# Patient Record
Sex: Female | Born: 1985 | Race: White | Hispanic: No | Marital: Married | Smoking: Never smoker
Health system: Southern US, Community
[De-identification: ages and names within clinical notes are randomized; demographics above are authoritative.]

## PROBLEM LIST (undated history)

## (undated) DIAGNOSIS — O139 Gestational [pregnancy-induced] hypertension without significant proteinuria, unspecified trimester: Secondary | ICD-10-CM

## (undated) DIAGNOSIS — Z803 Family history of malignant neoplasm of breast: Secondary | ICD-10-CM

## (undated) DIAGNOSIS — Z8719 Personal history of other diseases of the digestive system: Secondary | ICD-10-CM

## (undated) DIAGNOSIS — Z789 Other specified health status: Secondary | ICD-10-CM

## (undated) DIAGNOSIS — G43909 Migraine, unspecified, not intractable, without status migrainosus: Secondary | ICD-10-CM

## (undated) DIAGNOSIS — Z8041 Family history of malignant neoplasm of ovary: Secondary | ICD-10-CM

## (undated) HISTORY — DX: Family history of malignant neoplasm of breast: Z80.3

## (undated) HISTORY — DX: Family history of malignant neoplasm of ovary: Z80.41

---

## 2005-12-26 ENCOUNTER — Ambulatory Visit (HOSPITAL_COMMUNITY): Admission: RE | Admit: 2005-12-26 | Discharge: 2005-12-26 | Payer: Self-pay | Admitting: Family Medicine

## 2007-06-17 ENCOUNTER — Emergency Department (HOSPITAL_COMMUNITY): Admission: EM | Admit: 2007-06-17 | Discharge: 2007-06-17 | Payer: Self-pay | Admitting: Emergency Medicine

## 2007-06-21 ENCOUNTER — Ambulatory Visit: Payer: Self-pay | Admitting: Gastroenterology

## 2007-07-15 ENCOUNTER — Ambulatory Visit: Payer: Self-pay | Admitting: Gastroenterology

## 2007-07-15 ENCOUNTER — Ambulatory Visit (HOSPITAL_COMMUNITY): Admission: RE | Admit: 2007-07-15 | Discharge: 2007-07-15 | Payer: Self-pay | Admitting: Gastroenterology

## 2007-07-15 ENCOUNTER — Encounter: Payer: Self-pay | Admitting: Gastroenterology

## 2009-03-09 ENCOUNTER — Ambulatory Visit (HOSPITAL_COMMUNITY): Admission: RE | Admit: 2009-03-09 | Discharge: 2009-03-09 | Payer: Self-pay | Admitting: Family Medicine

## 2011-01-10 NOTE — Op Note (Signed)
Cynthia [name], Cynthia [name] NO.:  [id number]   MEDICAL RECORD NO.:  [id number]          PATIENT TYPE:  AMB   LOCATION:  DAY                           FACILITY:  [facility]   PHYSICIAN:  Kassie Mends, M.D.      DATE OF BIRTH:  03-Oct-1985   DATE OF PROCEDURE:  07/15/2007  DATE OF DISCHARGE:                               OPERATIVE REPORT   PRIMARY PHYSICIAN:  Donna Bernard, M.D.   PROCEDURE:  Colonoscopy with random cold forceps biopsy and [employer] resolution clip placement.   INDICATION FOR EXAM:  [name] is a 25 year old female with a history  of chronic loose stool as well as diarrhea.  She presented to the  emergency department in October 2008 with rectal bleeding.  She has seen  three episodes of rectal bleeding since her visit to the emergency  department.  Her last episode was on Saturday.  She denies any rectal  pain initially but when her rectal exam was performed it was  uncomfortable.   FINDINGS:  1. Normal distal terminal ileum.  Approximately 10 cm of the distal      terminal ileum was visualized.  2. Normal cecum and ileocecal valve.  Otherwise normal colon without      evidence of polyps, masses, inflammatory changes, diverticula or      arteriovenous malformations.  3. Normal retroflexed view of the rectum.   DIAGNOSIS:  No source for [name]'s rectal bleeding was identified.  She may have an anal fissure.   RECOMMENDATIONS:  1. She should follow high fiber diet.  She is given a prescription for      Anusol [facility] and asked to use it twice daily per rectum for the next      14 days.  2. She should have no MRI for 30 days due to her [name]      resolution clip placement.  3. No aspirin, NSAIDs or anticoagulation for seven days.  4. Return patient visit in six weeks.  5. Age appropriate colon cancer screening and would consider using      propofol.   MEDICATIONS:  1. Demerol 100 mg IV.  2. Versed 8 mg IV.  3. Phenergan 12.5  mg IV.   PROCEDURE TECHNIQUE:  Physical exam was performed and informed consent  was obtained from the patient after explaining the benefits, risks and  alternatives to the procedure.  The patient was connected to the monitor  and placed in left lateral position.  Continuous oxygen was provided by  nasal cannula and IV medicine administered through an indwelling  cannula.  After administration of sedation and rectal exam, the  patient's rectum was intubated.  The scope was advanced under direct  visualization to the distal terminal ileum.  She had a somewhat tortuous  sigmoid colon.  The scope was withdrawn slowly by carefully examining  color, texture, anatomy and integrity of mucosa on the way out.  As the  scope was withdrawn.  Random colon biopsies were performed to evaluate  for  microscopic colitis.  The last biopsy which was  approximately 45 cm from  the anal verge had a prolonged active bleeding.  A [employer]  resolution clip was placed and hemostasis was achieved.  The patient was  recovered in endoscopy and discharged home in satisfactory condition.      Kassie Mends, M.D.  Electronically Signed     SM/MEDQ  D:  07/15/2007  T:  07/15/2007  Job:  [id number]   cc:   Rhae Lerner. Margretta Ditty, M.D.  [address]  [place]  [place] [address]   W. Simone Curia, M.D.  Fax: [phone]

## 2011-01-10 NOTE — H&P (Signed)
NAMEJASANI, LENGEL                ACCOUNT NO.:  [id number]   MEDICAL RECORD NO.:  [id number]          PATIENT TYPE:  AMB   LOCATION:  DAY                           FACILITY:  [facility]   PHYSICIAN:  Kassie Mends, M.D.      DATE OF BIRTH:  [birth year]   DATE OF ADMISSION:  DATE OF DISCHARGE:  LH                              HISTORY & PHYSICAL   CHIEF COMPLAINT:  Rectal bleeding.   HISTORY OF PRESENT ILLNESS:  Cynthia [name] is a 25 year old female.  She  noticed a large amount of bright red blood on the toilet paper.  She  tells me it was the amount of a menstrual cycle.  She was quite scared.  She went to [facility] ER the same day on June 17, 2007.  She has had one other episode this week.  She denies any problems with  abdominal pain.  She denies any nausea or vomiting.  She denies any  heartburn or indigestion.  She does have diarrhea on almost a daily  basis.  She says she has about 2-3 loose stools a day about 3 days out  of every week.  She denies any history of constipation.  She denies any  medications.  She denies any fever or chills.  She denies any  proctalgia.  Her weight has remained stable.   She was found to have a mildly low hematocrit at 34 and tells me that  she donated blood about 2 weeks ago.   PAST MEDICAL HISTORY:  1. IBS.  2. Migraine headaches.  3. Urethral dilatation.   CURRENT MEDICATIONS:  1. Ortho Evra birth control patch.  2. Topamax 25 mg b.i.d.   ALLERGIES:  NO KNOWN DRUG ALLERGIES.   FAMILY HISTORY:  Positive for mother age 44 with colitis.  Father is  healthy.  She has 1 healthy sister.   SOCIAL HISTORY:  Cynthia [name] is married.  She works as an Designer, television/film set at  [employer].  She denies any tobacco or drug use.  She consumes  alcohol about once a month.   REVIEW OF SYSTEMS:  CONSTITUTIONAL:  She HPI.  GYN:  Last menstrual  period June 02, 2007.  She tells me she has regular cycles.  She is on  birth control patch.  She has cycles  about every 28 days.  She denies  any heavy flow.  HEMATOLOGIC:  She denies any history of anemia or blood  dyscrasias.   PHYSICAL EXAMINATION:  VITAL SIGNS:  Weight 197 pounds.  Height 63  inches.  Temperature 98.1, blood pressure 102/72, pulse 72.  GENERAL:  Cynthia [name] is an obese Caucasian female who is alert,  oriented, pleasant, cooperative and in no acute distress.  HEENT:  Sclerae clear.  Conjunctivae pink.  Oropharynx pink and moist  without any lesions.  NECK:  Supple without mass or thyromegaly.  CHEST:  Regular rate and rhythm.  Normal S1 and S2 without murmurs,  clicks, rubs, or gallops.  LUNGS:  Clear to auscultation bilaterally.  ABDOMEN:  Protuberant with positive bowel sounds x4.  No bruits  auscultated.  Soft, nontender, nondistended.  No masses or  hepatosplenomegaly.  No rebound tenderness or guarding.  EXTREMITIES:  Without clubbing or edema bilaterally.  SKIN:  Pink, warm, and dry without any rashes or jaundice.   LABORATORY DATA:  From June 17, 2007, WBC 7.7, hemoglobin 12,  hematocrit 34, platelets 305.   IMPRESSION:  Cynthia [name] is a 25 year old Caucasian female with 2  episodes of significantly large-volume rectal bleeding.  She has a  longstanding history of chronic diarrhea most days of the week but has  not noticed blood until recently.  Further evaluation is warranted to  determine the etiology of rectal bleeding to rule out inflammatory bowel  disease, colorectal carcinoma, polyps, or benign anorectal source such  as hemorrhoids or fissure.   Her mild anemia may be related to recent blood donation 2 weeks ago.   PLAN:  Colonoscopy with Dr. Cira Servant.  We discussed the procedure including  the risks and benefits which include but are not limited to bleeding,  infection, perforation, drug reaction.  She agrees, and informed consent  will be obtained.   We would like to thank Dr. Margretta Ditty for allowing [place] to participate in  the care of Ms.  Cynthia [name].      Lorenza Burton, N.P.      Kassie Mends, M.D.  Electronically Signed    KJ/MEDQ  D:  06/23/2007  T:  06/23/2007  Job:  [id number]

## 2011-06-06 LAB — CBC
Hemoglobin: 12.1
MCHC: 34.2
RDW: 11.8
WBC: 7.9

## 2011-06-07 LAB — DIFFERENTIAL
Basophils Absolute: 0
Basophils Relative: 1
Lymphs Abs: 2.5
Monocytes Absolute: 0.6
Monocytes Relative: 8
Neutrophils Relative %: 58

## 2011-06-07 LAB — CBC
MCHC: 35.2
Platelets: 305
RBC: 4.06
WBC: 7.7

## 2013-06-06 ENCOUNTER — Ambulatory Visit (HOSPITAL_COMMUNITY)
Admission: RE | Admit: 2013-06-06 | Discharge: 2013-06-06 | Disposition: A | Discharge status: Home / Self Care | Payer: 59 | Source: Ambulatory Visit | Attending: Family Medicine | Admitting: Family Medicine

## 2013-06-06 NOTE — Lactation Note (Signed)
Adult Lactation Consultation Outpatient Visit Note  Patient Name: Cynthia [name]      BABY: Case Whiteside Date of Birth: [birth year]                    DOB:  [birth year] Gestational Age at Delivery: term      BIRTH WEIGHT: 10-10 Type of Delivery: C/S                         WEIGHT TODAY: 13-3.9  Breastfeeding History: Frequency of Breastfeeding: REFUSING BREAST Length of Feeding:  Voids: QS Stools: QS  Recently green, loose  Supplementing / Method:expressed breast milk 6 oz every 3 hours Pumping:  Type of Pump:medela pump in style   Frequency:every 3-4 hours  Volume:  5 oz  Comments:    Consultation Evaluation:  Mom and 40 month old baby here today due to recent breast refusal.  Mom states baby latched easily at birth and has nursed very well until Wednesday.  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  She states the 2 days prior to breast refusal she worked longer days.  Mom denies signs or symptoms of illness in baby and states baby has not acted fussy other than at the breast.  No sign of oral thrush in baby's mouth.  Baby smiling and engaging.  Baby did have a large green malodorous loose stool during visit.  Mom stated stools are normally yellow.  Mom attempted to latch baby on in different holds but baby immediately starts screaming when placed at breast and no attempt to latch.  24 mm nipple shield prefilled with milk tried without success.  Mom gave baby 2 oz of EBM per bottle and baby eagerly accepted it.  Several attempts to calm baby and latch to breast but baby frantic screaming.  Hints on breast refusal or nursing strike copied from Rayetta Humphrey Breastfeeding answer book.  Breastfeeding resource sheet also given to mom.  Mom lives in [place] and I suggested she contact [employer] league in the area.  Encouraged to work with hints provided and if no improvement over the next few days I suggested she may want to have a MD check to rule out ear  infection or illness.  Initial Feeding Assessment:baby would not latch Pre-feed Weight: Post-feed Weight: Amount Transferred: Comments:  Additional Feeding Assessment: Pre-feed Weight: Post-feed Weight: Amount Transferred: Comments:  Additional Feeding Assessment: Pre-feed Weight: Post-feed Weight: Amount Transferred: Comments:  Total Breast milk Transferred this Visit:  Total Supplement Given:   Additional Interventions:   Follow-Up will call prn      Hansel Feinstein 06/06/2013, 10:16 AM

## 2013-06-11 ENCOUNTER — Encounter (HOSPITAL_COMMUNITY): Payer: Self-pay

## 2013-11-11 ENCOUNTER — Encounter: Payer: Self-pay | Admitting: Family Medicine

## 2013-11-11 ENCOUNTER — Ambulatory Visit (INDEPENDENT_AMBULATORY_CARE_PROVIDER_SITE_OTHER): Payer: 59 | Admitting: Family Medicine

## 2013-11-11 VITALS — BP 130/90 | Temp 98.3°F | Ht 64.0 in | Wt 280.0 lb

## 2013-11-11 DIAGNOSIS — J329 Chronic sinusitis, unspecified: Secondary | ICD-10-CM

## 2013-11-11 MED ORDER — BENZONATATE 100 MG PO CAPS: 100.0000 mg | ORAL_CAPSULE | Freq: Four times a day (QID) | ORAL | Status: DC | PRN

## 2013-11-11 MED ORDER — LEVOFLOXACIN 500 MG PO TABS: 500.0000 mg | ORAL_TABLET | Freq: Every day | ORAL | Status: AC

## 2013-11-11 NOTE — Progress Notes (Signed)
   Subjective:    Patient ID: Cynthia CorningMindy R [name], female    DOB: 1986-04-29, 28 y.o.   MRN: [id number]  Cough This is a new problem. Episode onset: A month ago. The problem has been waxing and waning. The problem occurs constantly. The cough is non-productive. Associated symptoms include ear pain, headaches, nasal congestion, rhinorrhea and a sore throat. Associated symptoms comments: Swollen right lymph node. Nothing aggravates the symptoms. Treatments tried: Ibu. The treatment provided no relief.    Congested and cough for about a month  Cough productive  Minimal prodctn Green disch nasal  Bad cough   Neck sweling and tender nodule palpated  Review of Systems  HENT: Positive for ear pain, rhinorrhea and sore throat.   Respiratory: Positive for cough.   Neurological: Positive for headaches.       Objective:   Physical Exam  alert moderate malaise. TMs normal. Bilateral cervical lymphadenopathy. Tender to palpation. Pharynx normal. Positive nasal discharge. Intermittent cough during exam. Bronchial cough heart regular in rhythm. Lungs clear.       Assessment & Plan:  Impression rhinosinusitis/lymphadenitis/bronchitis plan Levaquin daily 10 days. Symptomatic care. Tessalon when necessary. Expect gradual resolution

## 2013-11-14 ENCOUNTER — Encounter: Payer: Self-pay | Admitting: Family Medicine

## 2014-05-21 ENCOUNTER — Telehealth: Payer: Self-pay | Admitting: Family Medicine

## 2014-05-21 ENCOUNTER — Other Ambulatory Visit: Payer: Self-pay | Admitting: *Deleted

## 2014-05-21 MED ORDER — BENZONATATE 100 MG PO CAPS: 100.0000 mg | ORAL_CAPSULE | Freq: Four times a day (QID) | ORAL | Status: DC | PRN

## 2014-05-21 NOTE — Telephone Encounter (Signed)
Numb thirty one q6

## 2014-05-21 NOTE — Telephone Encounter (Signed)
Has a cough and requesting tessalon. No fever and no wheezing.

## 2014-05-21 NOTE — Telephone Encounter (Signed)
Patient said that she thinks she has come down with a slight cold and she has a bad cough. She said that its not urgent enough to come into the office and get an antibiotic. She just wants to know if we can send in Rx for benzonatate (TESSALON) 100 MG capsule.   [facility]

## 2014-05-21 NOTE — Telephone Encounter (Signed)
Med sent to pharm. Pt notified.  

## 2014-06-01 ENCOUNTER — Encounter: Payer: Self-pay | Admitting: Nurse Practitioner

## 2014-06-01 ENCOUNTER — Encounter: Payer: Self-pay | Admitting: Family Medicine

## 2014-06-01 ENCOUNTER — Ambulatory Visit (INDEPENDENT_AMBULATORY_CARE_PROVIDER_SITE_OTHER): Payer: 59 | Admitting: Nurse Practitioner

## 2014-06-01 VITALS — BP 128/88 | Temp 98.8°F | Ht 64.0 in | Wt 285.0 lb

## 2014-06-01 DIAGNOSIS — J069 Acute upper respiratory infection, unspecified: Secondary | ICD-10-CM

## 2014-06-01 DIAGNOSIS — L049 Acute lymphadenitis, unspecified: Secondary | ICD-10-CM

## 2014-06-01 MED ORDER — AMOXICILLIN-POT CLAVULANATE 875-125 MG PO TABS: 1.0000 | ORAL_TABLET | Freq: Two times a day (BID) | ORAL | Status: DC

## 2014-06-01 MED ORDER — HYDROCODONE-HOMATROPINE 5-1.5 MG/5ML PO SYRP: 5.0000 mL | ORAL_SOLUTION | ORAL | Status: DC | PRN

## 2014-06-05 ENCOUNTER — Encounter: Payer: Self-pay | Admitting: Nurse Practitioner

## 2014-06-05 NOTE — Progress Notes (Signed)
Subjective:  Presents complaints of sore throat swollen lymph nodes ear pain and cough for the past 4 days. Has had congestion and cough for about 2 weeks, worse over the past few days. No fever. Slight headache. Cough has improved. Right ear pain and enlarged right lymph nodes no vomiting diarrhea or abdominal pain. No wheezing. According to patient she tends to get enlarged lymph nodes easily, had a similar problem about 3 years ago. No difficulty swallowing.  Objective:   BP 128/88  Temp(Src) 98.8 F (37.1 C) (Oral)  Ht 5\' 4"  (1.626 m)  Wt 285 lb (129.275 kg)  BMI 48.90 kg/m2 NAD. Alert, oriented. TMs significant clear effusion, no erythema, worse on the right. Pharynx mildly erythematous with green PND noted. Neck supple with significant right anterior mildly tender adenopathy, mild on the left side. Lungs clear. Heart regular rate rhythm.  Assessment: Acute upper respiratory infection  Acute lymphadenitis  Plan:  Meds ordered this encounter  Medications  . amoxicillin-clavulanate (AUGMENTIN) 875-125 MG per tablet    Sig: Take 1 tablet by mouth 2 (two) times daily.    Dispense:  20 tablet    Refill:  0    Order Specific Question:  Supervising Provider    Answer:  Merlyn AlbertLUKING, WILLIAM S [[id number]]  . HYDROcodone-homatropine (HYCODAN) 5-1.5 MG/5ML syrup    Sig: Take 5 mLs by mouth every 4 (four) hours as needed.    Dispense:  120 mL    Refill:  0    Order Specific Question:  Supervising Provider    Answer:  Merlyn AlbertLUKING, WILLIAM S [[id number]]   Call back by the end of the week if no improvement, sooner if worse. Recheck if lymphadenitis persist, expect gradual resolution over the next 2-3 weeks.

## 2014-06-08 ENCOUNTER — Other Ambulatory Visit: Payer: Self-pay | Admitting: *Deleted

## 2014-06-08 ENCOUNTER — Telehealth: Payer: Self-pay | Admitting: Family Medicine

## 2014-06-08 MED ORDER — FLUCONAZOLE 150 MG PO TABS: ORAL_TABLET | ORAL | Status: DC

## 2014-06-08 MED ORDER — CLARITHROMYCIN 500 MG PO TABS: 500.0000 mg | ORAL_TABLET | Freq: Two times a day (BID) | ORAL | Status: DC

## 2014-06-08 NOTE — Telephone Encounter (Signed)
meds sent to pharm. Pt notified.  

## 2014-06-08 NOTE — Telephone Encounter (Signed)
Still taking augmentin. Still has 5 days left on antibiotic. Having dizziness and fever 101, 100.8. Also wanting rx for yeast infection. Having vaginal itching and white discharge.

## 2014-06-08 NOTE — Telephone Encounter (Signed)
Seen 06/01/14 diagnosed with URI and acute lymphadenitis. Prescribed augmentin. [facility].

## 2014-06-08 NOTE — Telephone Encounter (Signed)
biaxin 500 bd ten d, diflucan 150 numb two one po three d apart

## 2014-06-08 NOTE — Telephone Encounter (Signed)
Patient was seen last week for URI and given antibiotics.  She says that she keeps getting a fever and dizzyness. She wants to make sure this is normal.

## 2014-06-12 ENCOUNTER — Encounter: Payer: Self-pay | Admitting: Nurse Practitioner

## 2014-06-12 ENCOUNTER — Encounter: Payer: Self-pay | Admitting: Family Medicine

## 2014-06-12 ENCOUNTER — Ambulatory Visit (INDEPENDENT_AMBULATORY_CARE_PROVIDER_SITE_OTHER): Payer: 59 | Admitting: Nurse Practitioner

## 2014-06-12 VITALS — BP 130/88 | Temp 98.4°F | Ht 64.0 in | Wt 280.0 lb

## 2014-06-12 DIAGNOSIS — J329 Chronic sinusitis, unspecified: Secondary | ICD-10-CM

## 2014-06-12 DIAGNOSIS — G43011 Migraine without aura, intractable, with status migrainosus: Secondary | ICD-10-CM

## 2014-06-12 MED ORDER — CEFTRIAXONE SODIUM 1 G IJ SOLR
1.0000 g | Freq: Once | INTRAMUSCULAR | Status: AC
  Administered 2014-06-12: 1 g via INTRAMUSCULAR

## 2014-06-12 MED ORDER — METHYLPREDNISOLONE ACETATE 40 MG/ML IJ SUSP
40.0000 mg | Freq: Once | INTRAMUSCULAR | Status: AC
  Administered 2014-06-12: 40 mg via INTRAMUSCULAR

## 2014-06-12 MED ORDER — PROMETHAZINE HCL 25 MG PO TABS: 25.0000 mg | ORAL_TABLET | ORAL | Status: DC | PRN

## 2014-06-12 MED ORDER — LEVOFLOXACIN 500 MG PO TABS: 500.0000 mg | ORAL_TABLET | Freq: Every day | ORAL | Status: DC

## 2014-06-18 ENCOUNTER — Encounter: Payer: Self-pay | Admitting: Nurse Practitioner

## 2014-06-18 NOTE — Progress Notes (Signed)
Subjective:  Presents for recheck. Treated for sinusitis on 10/5. Took augmentin until 10/12; had fever and dizziness; switched to Biaxin; unable to take med, made her throw up. At this time, having fever, 101 yesterday. Off/on vomiting since yesterday. No diarrhea or abd pain. Facial area headache. Throbbing/pounding headache. Remote history of migraines. left ear pain. Sore throat has improved. No neck pain or stiffness. No visual changes. photosensitivity. No wheezing. Slight cough. Taking fluids well. Voiding nl.   Objective:   BP 130/88  Temp(Src) 98.4 F (36.9 C) (Oral)  Ht 5\' 4"  (1.626 m)  Wt 280 lb (127.007 kg)  BMI 48.04 kg/m2 NAD. Alert, oriented. TMs clear effusion, no erythema. Pharynx injected with PND noted. Neck supple, mild anterior adenopathy. Lungs clear. Heart RRR. Abdomen soft, nondistended, nontender.   Assessment: Rhinosinusitis - Plan: methylPREDNISolone acetate (DEPO-MEDROL) injection 40 mg, cefTRIAXone (ROCEPHIN) injection 1 g  Intractable migraine without aura and with status migrainosus  Plan:  Meds ordered this encounter  Medications  . promethazine (PHENERGAN) 25 MG tablet    Sig: Take 1 tablet (25 mg total) by mouth every 4 (four) hours as needed for nausea or vomiting.    Dispense:  30 tablet    Refill:  0    Order Specific Question:  Supervising Provider    Answer:  Merlyn AlbertLUKING, WILLIAM S [[id number]]  . levofloxacin (LEVAQUIN) 500 MG tablet    Sig: Take 1 tablet (500 mg total) by mouth daily.    Dispense:  10 tablet    Refill:  0    Start 10/17    Order Specific Question:  Supervising Provider    Answer:  Merlyn AlbertLUKING, WILLIAM S [[id number]]  . methylPREDNISolone acetate (DEPO-MEDROL) injection 40 mg    Sig:   . cefTRIAXone (ROCEPHIN) injection 1 g    Sig:     Order Specific Question:  Antibiotic Indication:    Answer:  Other Indication (list below)   Warning signs reviewed with patient and her husband. Call back in AM in no better, go to ED over the weekend if  worse.

## 2014-07-21 ENCOUNTER — Encounter: Payer: Self-pay | Admitting: Nurse Practitioner

## 2014-07-21 ENCOUNTER — Ambulatory Visit (INDEPENDENT_AMBULATORY_CARE_PROVIDER_SITE_OTHER): Payer: 59 | Admitting: Nurse Practitioner

## 2014-07-21 VITALS — BP 132/90 | Temp 98.4°F | Ht 64.0 in | Wt 283.0 lb

## 2014-07-21 DIAGNOSIS — I889 Nonspecific lymphadenitis, unspecified: Secondary | ICD-10-CM

## 2014-07-21 DIAGNOSIS — J300 Vasomotor rhinitis: Secondary | ICD-10-CM

## 2014-07-21 NOTE — Patient Instructions (Signed)
OTC antihistamines nasacort AQ as directed

## 2014-07-25 ENCOUNTER — Encounter: Payer: Self-pay | Admitting: Nurse Practitioner

## 2014-07-25 NOTE — Progress Notes (Signed)
Subjective:  Presents for complaints of recurrent lymphadenopathy, patient counts at least 5 times this year. No unusual fevers. No chills. No unexplained weight loss. No unusual fatigue. Nonsmoker. Denies oral tobacco use. No mouth sores or lesions. Regular dental care. Began having some cough and congestion 2 days ago mainly in the mornings. Lymph nodes have enlarged but not is hard as last time. No headache sore throat ear pain or wheezing.  Objective:   BP 132/90 mmHg  Temp(Src) 98.4 F (36.9 C)  Ht 5\' 4"  (1.626 m)  Wt 283 lb (128.368 kg)  BMI 48.55 kg/m2 NAD. Alert, oriented. TMs mild clear effusion, no erythema. Pharynx mildly injected with clear PND noted. Neck supple with moderate firm anterior cervical adenopathy, large firm slightly tender submental node noted. Lungs clear. Heart regular rate rhythm.  Assessment: Vasomotor rhinitis  Lymphadenitis - Plan: CBC with Differential, Ambulatory referral to ENT  Plan: OTC meds as directed for congestion. CBC pending. Refer to ENT specialist for further evaluation, patient to call back sooner if any problems.

## 2014-09-16 ENCOUNTER — Encounter: Payer: Self-pay | Admitting: Family Medicine

## 2014-09-16 ENCOUNTER — Ambulatory Visit (INDEPENDENT_AMBULATORY_CARE_PROVIDER_SITE_OTHER): Payer: 59 | Admitting: Family Medicine

## 2014-09-16 VITALS — BP 124/80 | Temp 99.1°F | Ht 64.0 in | Wt 284.4 lb

## 2014-09-16 DIAGNOSIS — R509 Fever, unspecified: Secondary | ICD-10-CM

## 2014-09-16 DIAGNOSIS — J039 Acute tonsillitis, unspecified: Secondary | ICD-10-CM

## 2014-09-16 DIAGNOSIS — I889 Nonspecific lymphadenitis, unspecified: Secondary | ICD-10-CM

## 2014-09-16 LAB — POCT RAPID STREP A (OFFICE): RAPID STREP A SCREEN: NEGATIVE

## 2014-09-16 MED ORDER — AZITHROMYCIN 250 MG PO TABS: ORAL_TABLET | ORAL | Status: DC

## 2014-09-16 MED ORDER — MAGIC MOUTHWASH: ORAL | Status: DC

## 2014-09-16 NOTE — Progress Notes (Signed)
   Subjective:    Patient ID: Cynthia [name], female    DOB: 1986/02/26, 29 y.o.   MRN: [id number]  Fever  This is a new problem. The current episode started in the past 7 days. The problem occurs intermittently. The problem has been unchanged. The maximum temperature noted was 103 to 103.9 F. Associated symptoms include coughing, ear pain, headaches, muscle aches and a sore throat. She has tried NSAIDs for the symptoms. The treatment provided mild relief.   Patient states that she has no other concerns at this time.   Head ache and sore throat and ear pain,  Not much cough,   Ear pain bad, also felt something stuck in the throat  Using ibuprofen, felt like pills stuck in the throat  Appetite puny, keeping u on fluid intake  Cut down solids     Review of Systems  Constitutional: Positive for fever.  HENT: Positive for ear pain and sore throat.   Respiratory: Positive for cough.   Neurological: Positive for headaches.       Objective:   Physical Exam  Alert moderate malaise. Vital stable. HEENT moderate nasal congestion very impressive exiting of tonsillitis. Anterior cervical lymphadenitis lungs clear heart regular in rhythm.      Assessment & Plan:  Impression exhibited tonsillitis with cervical lymphadenitis plan antibiotics prescribed. Symptomatic care discussed. Warning signs discussed. WSL

## 2014-09-17 ENCOUNTER — Other Ambulatory Visit: Payer: Self-pay | Admitting: *Deleted

## 2014-09-17 LAB — STREP A DNA PROBE: GASP: NEGATIVE

## 2014-09-17 MED ORDER — MAGIC MOUTHWASH: ORAL | Status: DC

## 2014-10-28 ENCOUNTER — Ambulatory Visit (INDEPENDENT_AMBULATORY_CARE_PROVIDER_SITE_OTHER): Payer: 59 | Admitting: Family Medicine

## 2014-10-28 ENCOUNTER — Encounter: Payer: Self-pay | Admitting: Family Medicine

## 2014-10-28 VITALS — BP 106/80 | Temp 98.5°F | Ht 64.0 in | Wt 289.0 lb

## 2014-10-28 DIAGNOSIS — I889 Nonspecific lymphadenitis, unspecified: Secondary | ICD-10-CM

## 2014-10-28 MED ORDER — AZITHROMYCIN 250 MG PO TABS: ORAL_TABLET | ORAL | Status: DC

## 2014-10-28 NOTE — Progress Notes (Signed)
   Subjective:    Patient ID: Cynthia [name], female    DOB: 1986-07-16, 29 y.o.   MRN: [id number]  HPI Patient is here today because she has swollen lymph nodes in her neck area. Patient is also having ear pain as a result of this. This has been present for about 2 days now. Patient has been taking ibuprofen for her symptoms with moderate relief. No other concerns at this time.  Patient keeps get an reoccurring infectionsseveral times per year that causes enlargement of the glands in the neck area mainly in the central portion.  Review of Systems  Constitutional: Negative for fever and activity change.  HENT: Positive for sore throat. Negative for congestion, ear pain and rhinorrhea.   Eyes: Negative for discharge.  Respiratory: Negative for cough and wheezing.   Cardiovascular: Negative for chest pain.       Objective:   Physical Exam  Constitutional: She appears well-developed.  HENT:  Head: Normocephalic.  Nose: Nose normal.  Mouth/Throat: Oropharynx is clear and moist. No oropharyngeal exudate.  Neck: Neck supple.  Cardiovascular: Normal rate and normal heart sounds.   No murmur heard. Pulmonary/Chest: Effort normal and breath sounds normal. She has no wheezes.  Lymphadenopathy:    She has cervical adenopathy.  Skin: Skin is warm and dry.  Nursing note and vitals reviewed.         Assessment & Plan:  This patient has lymphadenopathy in the central part of her neck. It seems to keep reoccurring on a regular basis. I would recommend that this patient get CBC I would also recommend that she follow-up in several weeks after treatment to make sure that this lymphadenopathy is gone away. It does raise into question the possibility of a thyroglossal abnormality. No referral currently recheck her in a few weeks time

## 2014-10-29 LAB — CBC WITH DIFFERENTIAL/PLATELET
Basophils Absolute: 0 10*3/uL (ref 0.0–0.2)
Basos: 0 %
EOS ABS: 0.1 10*3/uL (ref 0.0–0.4)
EOS: 1 %
HCT: 39.6 % (ref 34.0–46.6)
Hemoglobin: 13.2 g/dL (ref 11.1–15.9)
IMMATURE GRANS (ABS): 0 10*3/uL (ref 0.0–0.1)
IMMATURE GRANULOCYTES: 0 %
Lymphocytes Absolute: 3.8 10*3/uL — ABNORMAL HIGH (ref 0.7–3.1)
Lymphs: 33 %
MCH: 27.6 pg (ref 26.6–33.0)
MCHC: 33.3 g/dL (ref 31.5–35.7)
MCV: 83 fL (ref 79–97)
MONOS ABS: 0.7 10*3/uL (ref 0.1–0.9)
Monocytes: 7 %
NEUTROS PCT: 59 %
Neutrophils Absolute: 6.8 10*3/uL (ref 1.4–7.0)
PLATELETS: 311 10*3/uL (ref 150–379)
RBC: 4.78 x10E6/uL (ref 3.77–5.28)
RDW: 13.9 % (ref 12.3–15.4)
WBC: 11.5 10*3/uL — ABNORMAL HIGH (ref 3.4–10.8)

## 2014-11-25 ENCOUNTER — Ambulatory Visit: Payer: 59 | Admitting: Family Medicine

## 2014-11-27 ENCOUNTER — Ambulatory Visit: Payer: 59 | Admitting: Family Medicine

## 2014-12-02 ENCOUNTER — Ambulatory Visit: Payer: 59 | Admitting: Family Medicine

## 2014-12-02 ENCOUNTER — Encounter: Payer: Self-pay | Admitting: Family Medicine

## 2014-12-11 ENCOUNTER — Encounter: Payer: Self-pay | Admitting: Family Medicine

## 2014-12-11 ENCOUNTER — Ambulatory Visit (INDEPENDENT_AMBULATORY_CARE_PROVIDER_SITE_OTHER): Payer: 59 | Admitting: Family Medicine

## 2014-12-11 VITALS — BP 114/86 | Temp 98.3°F | Ht 64.0 in | Wt 285.0 lb

## 2014-12-11 DIAGNOSIS — I889 Nonspecific lymphadenitis, unspecified: Secondary | ICD-10-CM | POA: Diagnosis not present

## 2014-12-11 NOTE — Progress Notes (Signed)
   Subjective:    Patient ID: Cynthia [name], female    DOB: 1985-10-18, 29 y.o.   MRN: [id number]  HPI Patient is here today for a follow up visit on cervical lymphadenitis. Patient has completed the Zpak. Patient states that her symptoms have improved.   Patient frustrated by recurrent swelling of glands. States it's happening every couple months for the past year. Next  Does have a young child at home with substantial exposures  Patient states that she has no other concerns today.  Patient would like the results to her recent blood work also.   Review of Systems No current headache sore throat chest pain    Objective:   Physical Exam  Alert vitals stable no acute distress HEENT no palpable lymphadenitis no palpable mass drinks normal neck supple.  Blood work reviewed with patient      Assessment & Plan:  Day number [phone] patient impression #1 cervical lymphadenitis resolved patient wondering why she gets it recurrently. Plan ENT referral at patient request WSL

## 2014-12-22 ENCOUNTER — Encounter: Payer: Self-pay | Admitting: Family Medicine

## 2015-01-26 ENCOUNTER — Telehealth: Payer: Self-pay | Admitting: Family Medicine

## 2015-01-26 NOTE — Telephone Encounter (Signed)
Discussed with patient. Patient will call back and schedule office visit for evaluation.

## 2015-01-26 NOTE — Telephone Encounter (Signed)
Ntsw, I'm sure the chiropractor's have had more than one visit with her. We'll be happy to see

## 2015-01-26 NOTE — Telephone Encounter (Signed)
Pt called stating that her shoulder is hurting and she has been going to a chiropractor for it. Pt started a new job recently and it is making it worse. Pt's insurance won't go in effect until 90 days. Pt is trying to be cost effective and would like to speak to someone regarding this. Pt doesn't want to do two appointments for this if she will be referred out for this.

## 2015-03-05 ENCOUNTER — Encounter: Payer: Self-pay | Admitting: Nurse Practitioner

## 2015-03-05 ENCOUNTER — Ambulatory Visit (INDEPENDENT_AMBULATORY_CARE_PROVIDER_SITE_OTHER): Payer: BLUE CROSS/BLUE SHIELD | Admitting: Nurse Practitioner

## 2015-03-05 VITALS — BP 122/86 | Ht 63.0 in | Wt 287.0 lb

## 2015-03-05 DIAGNOSIS — G43009 Migraine without aura, not intractable, without status migrainosus: Secondary | ICD-10-CM

## 2015-03-05 MED ORDER — RIZATRIPTAN BENZOATE 10 MG PO TBDP: 10.0000 mg | ORAL_TABLET | ORAL | Status: DC | PRN

## 2015-03-05 MED ORDER — PROPRANOLOL HCL ER 80 MG PO CP24: 80.0000 mg | ORAL_CAPSULE | Freq: Every day | ORAL | Status: DC

## 2015-03-05 MED ORDER — NAPROXEN 500 MG PO TABS: 500.0000 mg | ORAL_TABLET | Freq: Two times a day (BID) | ORAL | Status: DC

## 2015-03-05 NOTE — Patient Instructions (Signed)
Call insurance to see if they cover bariatric surgery Choose a provider (go online to [facility] website) Go to information session

## 2015-03-09 ENCOUNTER — Encounter: Payer: Self-pay | Admitting: Nurse Practitioner

## 2015-03-09 NOTE — Progress Notes (Addendum)
Subjective:  Presents for recheck on her migraines. Began having them in middle school into her teen years. Resolved for several years. Began again about 2 years ago after the birth of her last child. Was having 1-2 headaches per week, did ear piercing as alternative therapy, now her headaches are every 2 weeks. Intense headaches which she describes as debilitating. Throbbing pain in the frontal area described as constant. Photosensitivity, phonophobia, nausea and vomiting. Usually occurs all day. No visual changes. No numbness or weakness of the face arms or legs. Denies use of daily analgesics. Triggers include stress and certain smells. Has an IUD for birth control, no cycles. Has not noticed any changes in the headaches with IUD use. No definite aura. Does not have a headache today.  Objective:   BP 122/86 mmHg  Ht 5\' 3"  (1.6 m)  Wt 287 lb (130.182 kg)  BMI 50.85 kg/m2 NAD. Alert, oriented. Lungs clear. Heart regular rate rhythm.  Assessment:  Problem List Items Addressed This Visit      Cardiovascular and Mediastinum   Migraine without aura and without status migrainosus, not intractable - Primary   Relevant Medications   propranolol ER (INDERAL LA) 80 MG 24 hr capsule   rizatriptan (MAXALT-MLT) 10 MG disintegrating tablet   naproxen (NAPROSYN) 500 MG tablet    Other Visit Diagnoses    Morbid obesity          Plan:  Meds ordered this encounter  Medications  . propranolol ER (INDERAL LA) 80 MG 24 hr capsule    Sig: Take 1 capsule (80 mg total) by mouth daily.    Dispense:  30 capsule    Refill:  2    Order Specific Question:  Supervising Provider    Answer:  Merlyn AlbertLUKING, WILLIAM S [[id number]]  . rizatriptan (MAXALT-MLT) 10 MG disintegrating tablet    Sig: Take 1 tablet (10 mg total) by mouth as needed for migraine. May repeat in 2 hours if needed; max 2 per 24 hrs    Dispense:  10 tablet    Refill:  0    Order Specific Question:  Supervising Provider    Answer:  Merlyn AlbertLUKING, WILLIAM S  [[id number]]  . naproxen (NAPROSYN) 500 MG tablet    Sig: Take 1 tablet (500 mg total) by mouth 2 (two) times daily with a meal.    Dispense:  30 tablet    Refill:  0    Order Specific Question:  Supervising Provider    Answer:  Merlyn AlbertLUKING, WILLIAM S [[id number]]   Has tried Topamax in the past with side effects. At onset of migraine take Maxalt and naproxen. Warning signs reviewed. To keep headache diary. Return in about 3 months (around 06/05/2015). Call back sooner if no improvement or symptoms worsen. Also time spent discussing options for morbid obesity, patient has struggled for years trying to lose weight. Will consider bariatric surgery as an option.

## 2015-03-17 ENCOUNTER — Other Ambulatory Visit: Payer: Self-pay | Admitting: Nurse Practitioner

## 2015-04-05 ENCOUNTER — Ambulatory Visit: Payer: BLUE CROSS/BLUE SHIELD | Admitting: Nurse Practitioner

## 2015-04-07 ENCOUNTER — Other Ambulatory Visit: Payer: Self-pay | Admitting: Orthopaedic Surgery

## 2015-04-09 ENCOUNTER — Other Ambulatory Visit: Payer: Self-pay | Admitting: Orthopaedic Surgery

## 2015-04-09 DIAGNOSIS — M25512 Pain in left shoulder: Secondary | ICD-10-CM

## 2015-04-12 ENCOUNTER — Other Ambulatory Visit: Payer: Self-pay | Admitting: Orthopaedic Surgery

## 2015-04-12 DIAGNOSIS — M25512 Pain in left shoulder: Secondary | ICD-10-CM

## 2015-04-14 ENCOUNTER — Telehealth: Payer: Self-pay | Admitting: Nurse Practitioner

## 2015-04-14 MED ORDER — RIZATRIPTAN BENZOATE 10 MG PO TBDP: 10.0000 mg | ORAL_TABLET | ORAL | Status: DC | PRN

## 2015-04-14 NOTE — Telephone Encounter (Signed)
Per Dr. Lorin Picket- 1 refill on Maxalt sent to pharmacy. Must keep follow up appointment. Patient verbalized understanding.

## 2015-04-14 NOTE — Telephone Encounter (Signed)
Patient has a follow up appt with Eber Jones on 04/23/2015.  She says that she was prescribed the rizatriptan (MAXALT-MLT) 10 MG disintegrating tablet and found that she had to take this more than she thought she would.  She says she has one pill left but would like to have a refill of this called in just in case.   [facility]

## 2015-04-19 ENCOUNTER — Other Ambulatory Visit: Payer: Self-pay

## 2015-04-21 ENCOUNTER — Other Ambulatory Visit: Payer: Self-pay

## 2015-04-23 ENCOUNTER — Encounter: Payer: Self-pay | Admitting: Nurse Practitioner

## 2015-04-23 ENCOUNTER — Ambulatory Visit (INDEPENDENT_AMBULATORY_CARE_PROVIDER_SITE_OTHER): Payer: BLUE CROSS/BLUE SHIELD | Admitting: Nurse Practitioner

## 2015-04-23 VITALS — BP 138/82 | Ht 63.0 in | Wt 285.2 lb

## 2015-04-23 DIAGNOSIS — G43009 Migraine without aura, not intractable, without status migrainosus: Secondary | ICD-10-CM

## 2015-04-23 MED ORDER — PROPRANOLOL HCL ER 120 MG PO CP24: 120.0000 mg | ORAL_CAPSULE | Freq: Every day | ORAL | Status: DC

## 2015-04-25 ENCOUNTER — Encounter: Payer: Self-pay | Admitting: Nurse Practitioner

## 2015-04-25 NOTE — Progress Notes (Signed)
Subjective:  Presents for recheck on her migraines. Has had one in the past 2 weeks. Has seen improvement on Propranolol. Notes stress is a trigger. Has relief with Maxalt. No change in symptomatology.   Objective:   BP 138/82 mmHg  Ht  (1.6 m)  Wt 285 lb 3.2 oz (129.366 kg)  BMI 50.53 kg/m2 NAD. Alert, oriented. Lungs clear. Heart RRR.   Assessment:  Problem List Items Addressed This Visit      Cardiovascular and Mediastinum   Migraine without aura and without status migrainosus, not intractable - Primary   Relevant Medications   propranolol ER (INDERAL LA) 120 MG 24 hr capsule     Plan:  Meds ordered this encounter  Medications  . propranolol ER (INDERAL LA) 120 MG 24 hr capsule    Sig: Take 1 capsule (120 mg total) by mouth daily. For migraine prevention    Dispense:  30 capsule    Refill:  2    Order Specific Question:  Supervising Provider    Answer:  Merlyn Albert [[id number]]   Increase Propranolol to 120 mg. Cautioned about potential dizziness with increase dose. Call back in 3-4 weeks if no improvement. Our goal is no more than one migraine per month.

## 2015-04-29 ENCOUNTER — Ambulatory Visit
Admission: RE | Admit: 2015-04-29 | Discharge: 2015-04-29 | Disposition: A | Discharge status: Home / Self Care | Payer: BLUE CROSS/BLUE SHIELD | Source: Ambulatory Visit | Attending: Orthopaedic Surgery | Admitting: Orthopaedic Surgery

## 2015-04-29 DIAGNOSIS — M25512 Pain in left shoulder: Secondary | ICD-10-CM

## 2015-06-08 ENCOUNTER — Other Ambulatory Visit: Payer: Self-pay | Admitting: Nurse Practitioner

## 2015-06-09 ENCOUNTER — Other Ambulatory Visit: Payer: Self-pay | Admitting: Family Medicine

## 2015-06-28 ENCOUNTER — Other Ambulatory Visit: Payer: Self-pay | Admitting: *Deleted

## 2015-06-28 ENCOUNTER — Telehealth: Payer: Self-pay | Admitting: Family Medicine

## 2015-06-28 MED ORDER — PREDNISONE 20 MG PO TABS: ORAL_TABLET | ORAL | Status: DC

## 2015-06-28 NOTE — Telephone Encounter (Signed)
Med sent to pharm. Pt notified.  

## 2015-06-28 NOTE — Telephone Encounter (Signed)
Please send then prednisone, #18,3qd for 3d then 2qd for 3d then 1qd for 3d Patient should get significantly better with this if ongoing troubles follow-up

## 2015-06-28 NOTE — Telephone Encounter (Signed)
[facility]

## 2015-06-28 NOTE — Telephone Encounter (Signed)
Can something be called in for pt only.
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
## 2015-06-30 ENCOUNTER — Other Ambulatory Visit: Payer: Self-pay | Admitting: Family Medicine

## 2015-07-19 ENCOUNTER — Ambulatory Visit: Payer: BLUE CROSS/BLUE SHIELD | Admitting: Nurse Practitioner

## 2015-08-03 ENCOUNTER — Other Ambulatory Visit: Payer: Self-pay | Admitting: Family Medicine

## 2015-08-05 ENCOUNTER — Ambulatory Visit: Payer: BLUE CROSS/BLUE SHIELD | Admitting: Nurse Practitioner

## 2015-08-13 ENCOUNTER — Encounter: Payer: Self-pay | Admitting: Nurse Practitioner

## 2015-08-13 ENCOUNTER — Ambulatory Visit (INDEPENDENT_AMBULATORY_CARE_PROVIDER_SITE_OTHER): Payer: BLUE CROSS/BLUE SHIELD | Admitting: Nurse Practitioner

## 2015-08-13 VITALS — BP 128/90 | Ht 63.0 in | Wt 294.0 lb

## 2015-08-13 DIAGNOSIS — J329 Chronic sinusitis, unspecified: Secondary | ICD-10-CM | POA: Diagnosis not present

## 2015-08-13 DIAGNOSIS — Z139 Encounter for screening, unspecified: Secondary | ICD-10-CM | POA: Diagnosis not present

## 2015-08-13 DIAGNOSIS — M6248 Contracture of muscle, other site: Secondary | ICD-10-CM

## 2015-08-13 DIAGNOSIS — G43009 Migraine without aura, not intractable, without status migrainosus: Secondary | ICD-10-CM | POA: Diagnosis not present

## 2015-08-13 DIAGNOSIS — M62838 Other muscle spasm: Secondary | ICD-10-CM | POA: Insufficient documentation

## 2015-08-13 MED ORDER — AZITHROMYCIN 250 MG PO TABS: ORAL_TABLET | ORAL | Status: DC

## 2015-08-13 NOTE — Patient Instructions (Signed)
Massage therapy Ice/heat applications Stretching TENS unit

## 2015-08-16 ENCOUNTER — Encounter: Payer: Self-pay | Admitting: Nurse Practitioner

## 2015-08-16 ENCOUNTER — Telehealth: Payer: Self-pay | Admitting: Nurse Practitioner

## 2015-08-16 MED ORDER — AMITRIPTYLINE HCL 10 MG PO TABS: 10.0000 mg | ORAL_TABLET | Freq: Every day | ORAL | Status: DC

## 2015-08-16 NOTE — Telephone Encounter (Signed)
Sent in Rx for amitriptyline an old antidepressant that we use in low doses to prevent migraines. Starting at lowest dose, plan to slowly titrate up dose if needed or tolerated. Keep me posted about how it is going.

## 2015-08-16 NOTE — Telephone Encounter (Signed)
Patient notified that [facility] for amitriptyline an old antidepressant that we use in low doses to prevent migraines. Starting at lowest dose, plan to slowly titrate up dose if needed or tolerated. Keep Eber JonesCarolyn posted about how it is going. Patient verbalized understanding.

## 2015-08-16 NOTE — Progress Notes (Signed)
Subjective:  Presents for recheck on her migraines. Had to stop Inderal after 2-3 doses due to feeling tired and jittery. Maxalt working well; taking up to 10 per month. No changes in migraines. Also, c/o cough x 2 weeks. Now producing green sputum. No fever. Sore throat and ear pain x 3 d. Slight sinus headache. One trigger for migraines appears to start with pain in the neck area.   Objective:   BP 128/90 mmHg  Ht 5\' 3"  (1.6 m)  Wt 294 lb (133.358 kg)  BMI 52.09 kg/m2 NAD. Alert, oriented. TMs retracted no erythema. Pharynx mild erythema; PND noted. Neck supple with mild anterior adenopathy. Lungs clear. Heart RRR. Tight tender muscles noted around cervical area and lateral neck.   Assessment:  Problem List Items Addressed This Visit      Cardiovascular and Mediastinum   Migraine without aura and without status migrainosus, not intractable - Primary   Relevant Medications   amitriptyline (ELAVIL) 10 MG tablet   Other Relevant Orders   Lipid panel   Hepatic function panel   Basic metabolic panel   TSH   VITAMIN D 25 Hydroxy (Vit-D Deficiency, Fractures)   Hemoglobin A1c     Musculoskeletal and Integument   Muscle spasms of neck   Relevant Orders   Lipid panel   Hepatic function panel   Basic metabolic panel   TSH   VITAMIN D 25 Hydroxy (Vit-D Deficiency, Fractures)   Hemoglobin A1c     Other   Morbid obesity due to excess calories (HCC)   Relevant Orders   Lipid panel   Hepatic function panel   Basic metabolic panel   TSH   VITAMIN D 25 Hydroxy (Vit-D Deficiency, Fractures)   Hemoglobin A1c    Other Visit Diagnoses    Rhinosinusitis        Relevant Medications    azithromycin (ZITHROMAX Z-PAK) 250 MG tablet    Other Relevant Orders    Lipid panel    Hepatic function panel    Basic metabolic panel    TSH    VITAMIN D 25 Hydroxy (Vit-D Deficiency, Fractures)    Hemoglobin A1c    Screening        Relevant Orders    Lipid panel    Hepatic function panel    Basic metabolic panel    TSH    VITAMIN D 25 Hydroxy (Vit-D Deficiency, Fractures)    Hemoglobin A1c      Plan;  Meds ordered this encounter  Medications  . azithromycin (ZITHROMAX Z-PAK) 250 MG tablet    Sig: Take 2 tablets (500 mg) on  Day 1,  followed by 1 tablet (250 mg) once daily on Days 2 through 5.    Dispense:  6 each    Refill:  0    Order Specific Question:  Supervising Provider    Answer:  Merlyn AlbertLUKING, WILLIAM S [[id number]]  . amitriptyline (ELAVIL) 10 MG tablet    Sig: Take 1 tablet (10 mg total) by mouth at bedtime.    Dispense:  30 tablet    Refill:  2    Order Specific Question:  Supervising Provider    Answer:  Merlyn AlbertLUKING, WILLIAM S [[id number]]   Massage therapy Ice/heat applications Stretching TENS unit OTC meds as directed for congestion and cough. Call back if worsens or persists.  Return in about 6 months (around 02/11/2016) for recheck.

## 2015-09-09 ENCOUNTER — Telehealth: Payer: Self-pay | Admitting: Nurse Practitioner

## 2015-09-09 ENCOUNTER — Other Ambulatory Visit: Payer: Self-pay | Admitting: Nurse Practitioner

## 2015-09-09 MED ORDER — VITAMIN D (ERGOCALCIFEROL) 1.25 MG (50000 UNIT) PO CAPS: 50000.0000 [IU] | ORAL_CAPSULE | ORAL | Status: DC

## 2015-09-09 NOTE — Telephone Encounter (Signed)
Called Patient and informed [facility] per [facility]-Received her labs from [facility].  #1. Fasting sugar is just above normal at 101 but her A1C is good.  #2. Liver, kidney and thyroid tests are normal.  #3. Cholesterol ok; her good cholesterol is low; increase activity Triglycerides are just above normal; include good fats in diet such as canola oil and olive oil. #4: her vitamin D level is too low. I will send in Rx for vitamin D once a week for 8 weeks. Once this is complete, start 1000 units per day OTC.  Patient verbalized understanding.

## 2015-09-09 NOTE — Telephone Encounter (Signed)
Received her labs from [facility].  #1. Fasting sugar is just above normal at 101 but her A1C is good.  #2. Liver, kidney and thyroid tests are normal.  #3. Cholesterol ok; her good cholesterol is low; increase activity Triglycerides are just above normal; include good fats in diet such as canola oil and olive oil. #4: her vitamin D level is too low. I will send in Rx for vitamin D once a week for 8 weeks. Once this is complete, start 1000 units per day OTC.

## 2015-09-15 ENCOUNTER — Other Ambulatory Visit: Payer: Self-pay | Admitting: Family Medicine

## 2015-11-02 ENCOUNTER — Telehealth: Payer: Self-pay | Admitting: Nurse Practitioner

## 2015-11-02 NOTE — Telephone Encounter (Signed)
She is only taking Rx vitamin D for 8 weeks total; does not need 90 day supply but we can send in 90 supply of her other med.

## 2015-11-02 NOTE — Telephone Encounter (Signed)
Received 90 rx request from patient's pharmacy for 90 day supply on Amitriptyline and Vitamin D2 1.25 Mg. May we refill with 90 day supply?

## 2015-11-05 ENCOUNTER — Other Ambulatory Visit: Payer: Self-pay | Admitting: *Deleted

## 2015-11-05 MED ORDER — AMITRIPTYLINE HCL 10 MG PO TABS: 10.0000 mg | ORAL_TABLET | Freq: Every day | ORAL | Status: DC

## 2015-11-10 ENCOUNTER — Other Ambulatory Visit: Payer: Self-pay | Admitting: Family Medicine

## 2015-11-25 ENCOUNTER — Other Ambulatory Visit: Payer: Self-pay | Admitting: *Deleted

## 2015-11-25 ENCOUNTER — Telehealth: Payer: Self-pay | Admitting: Family Medicine

## 2015-11-25 MED ORDER — FLUCONAZOLE 150 MG PO TABS
ORAL_TABLET | ORAL | Status: DC
Start: 2015-11-25 — End: 2016-02-14

## 2015-11-25 NOTE — Telephone Encounter (Signed)
Patient needs prescription for diflucan called [facility].

## 2015-11-25 NOTE — Telephone Encounter (Signed)
White vaginal discharge and itching. Diflucan sent in per protocol. Pt notified

## 2015-12-20 ENCOUNTER — Other Ambulatory Visit: Payer: Self-pay | Admitting: Family Medicine

## 2016-01-06 ENCOUNTER — Other Ambulatory Visit: Payer: Self-pay | Admitting: Family Medicine

## 2016-01-14 ENCOUNTER — Encounter: Payer: Self-pay | Admitting: Nurse Practitioner

## 2016-01-14 ENCOUNTER — Ambulatory Visit (INDEPENDENT_AMBULATORY_CARE_PROVIDER_SITE_OTHER): Payer: BLUE CROSS/BLUE SHIELD | Admitting: Nurse Practitioner

## 2016-01-14 VITALS — BP 134/82 | Ht 63.0 in | Wt 287.1 lb

## 2016-01-14 DIAGNOSIS — G43009 Migraine without aura, not intractable, without status migrainosus: Secondary | ICD-10-CM | POA: Diagnosis not present

## 2016-01-17 ENCOUNTER — Encounter: Payer: Self-pay | Admitting: Nurse Practitioner

## 2016-01-17 NOTE — Progress Notes (Signed)
Subjective:  Presents for routine follow-up. Is undergoing workup for the gastric sleeve. Migraine headaches have been stable on current regimen. Does not drink any sodas. Has decreased her intake of sweet tea, used to drink it every day now only has it occasionally. Very active job enough where she quotes breaks a sweat" several times a day. Has are seen a dietitian and psychologist for her workup.  Objective:   BP 134/82 mmHg  Ht 5\' 3"  (1.6 m)  Wt 287 lb 2 oz (130.239 kg)  BMI 50.87 kg/m2 NAD. Alert, oriented. Lungs clear. Heart regular rate rhythm. Has lost 7 pounds since last visit.  Assessment:  Problem List Items Addressed This Visit      Cardiovascular and Mediastinum   Migraine without aura and without status migrainosus, not intractable - Primary     Other   Morbid obesity due to excess calories (HCC)     Plan: Continue current health habits, recommend decreasing simple carbs in her diet. Return in about 1 month (around 02/14/2016) for recheck.

## 2016-02-11 ENCOUNTER — Ambulatory Visit: Payer: BLUE CROSS/BLUE SHIELD | Admitting: Nurse Practitioner

## 2016-02-14 ENCOUNTER — Encounter: Payer: Self-pay | Admitting: Nurse Practitioner

## 2016-02-14 ENCOUNTER — Ambulatory Visit (INDEPENDENT_AMBULATORY_CARE_PROVIDER_SITE_OTHER): Payer: BLUE CROSS/BLUE SHIELD | Admitting: Nurse Practitioner

## 2016-02-14 VITALS — BP 122/84 | Ht 63.0 in | Wt 280.2 lb

## 2016-02-14 DIAGNOSIS — G43009 Migraine without aura, not intractable, without status migrainosus: Secondary | ICD-10-CM

## 2016-02-14 NOTE — Progress Notes (Signed)
Subjective:  Presents for recheck on her migraines. Has had 3 migraines in the past month. Two were minor; one was prolonged; went to local ED. Maxalt did not help that headache. All started on the right side of the neck with a sharp pain for a few minutes that progressed into a migraine. Also here for her monthly weight check for bariatric surgery. Active job. Has maintained lifestyle changes from previous visit. Using "Loseit" app on her phone which is helping. Has completely changed her diet. Rare fast food. No biscuits. Has an appointment with exercise physiologist.  Objective:   BP 122/84 mmHg  Ht 5\' 3"  (1.6 m)  Wt 280 lb 3.2 oz (127.098 kg)  BMI 49.65 kg/m2 NAD. Alert, oriented. Lungs clear. Heart RRR. Has lost 7 lbs since last visit.   Assessment:  Problem List Items Addressed This Visit      Cardiovascular and Mediastinum   Migraine without aura and without status migrainosus, not intractable - Primary     Other   Morbid obesity due to excess calories (HCC)     Plan: increase amitriptyline to 20 mg. Contact office about new dose to see if if helps migraines and any side effects.  Return in about 1 month (around 03/15/2016) for recheck.

## 2016-03-09 ENCOUNTER — Other Ambulatory Visit: Payer: Self-pay | Admitting: Nurse Practitioner

## 2016-03-14 ENCOUNTER — Telehealth: Payer: Self-pay | Admitting: Family Medicine

## 2016-03-14 MED ORDER — AMITRIPTYLINE HCL 10 MG PO TABS: ORAL_TABLET | ORAL | Status: DC

## 2016-03-14 NOTE — Telephone Encounter (Signed)
amitriptyline (ELAVIL) 10 MG tablet  [facility] not received this refill  Can we resend it

## 2016-03-14 NOTE — Telephone Encounter (Signed)
Patient was notified. Med sent in.

## 2016-03-17 ENCOUNTER — Encounter: Payer: Self-pay | Admitting: Nurse Practitioner

## 2016-03-17 ENCOUNTER — Ambulatory Visit (INDEPENDENT_AMBULATORY_CARE_PROVIDER_SITE_OTHER): Payer: BLUE CROSS/BLUE SHIELD | Admitting: Nurse Practitioner

## 2016-03-17 VITALS — BP 132/82 | Ht 63.0 in | Wt 278.0 lb

## 2016-03-17 DIAGNOSIS — G43009 Migraine without aura, not intractable, without status migrainosus: Secondary | ICD-10-CM | POA: Diagnosis not present

## 2016-03-17 MED ORDER — PHENTERMINE HCL 37.5 MG PO TABS: 37.5000 mg | ORAL_TABLET | Freq: Every day | ORAL | Status: DC

## 2016-03-20 ENCOUNTER — Encounter: Payer: Self-pay | Admitting: Nurse Practitioner

## 2016-03-20 NOTE — Progress Notes (Signed)
Subjective:  Presents for recheck. Has decreased sugar in diet. Still not drinking sweet tea. Has done well with weight loss. Plans to go to gym with sister. No migraines over the past months since last visit. Would like to start Phentermine again to jump start weight loss.   Objective:   BP 132/82   Ht 5\' 3"  (1.6 m)   Wt 278 lb (126.1 kg)   BMI 49.25 kg/m  NAD. Alert, oriented. Lungs clear. Heart RRR.   Assessment:  Problem List Items Addressed This Visit      Cardiovascular and Mediastinum   Migraine without aura and without status migrainosus, not intractable - Primary     Other   Morbid obesity due to excess calories (HCC)   Relevant Medications   phentermine (ADIPEX-P) 37.5 MG tablet    Other Visit Diagnoses   None.    Plan:  Meds ordered this encounter  Medications  . phentermine (ADIPEX-P) 37.5 MG tablet    Sig: Take 1 tablet (37.5 mg total) by mouth daily before breakfast.    Dispense:  30 tablet    Refill:  2    Order Specific Question:   Supervising Provider    Answer:   Merlyn Albert [[id number]]   Restart Phentermine. Encouraged continued healthy diet and exercise. Return in about 1 month (around 04/17/2016) for recheck.

## 2016-03-28 ENCOUNTER — Other Ambulatory Visit: Payer: Self-pay | Admitting: Family Medicine

## 2016-04-23 ENCOUNTER — Other Ambulatory Visit: Payer: Self-pay | Admitting: Family Medicine

## 2016-04-28 ENCOUNTER — Ambulatory Visit: Payer: BLUE CROSS/BLUE SHIELD | Admitting: Nurse Practitioner

## 2016-04-28 DIAGNOSIS — Z0289 Encounter for other administrative examinations: Secondary | ICD-10-CM

## 2016-05-04 ENCOUNTER — Encounter: Payer: Self-pay | Admitting: Family Medicine

## 2016-07-10 ENCOUNTER — Telehealth: Payer: Self-pay | Admitting: Family Medicine

## 2016-07-10 MED ORDER — FLUCONAZOLE 150 MG PO TABS: ORAL_TABLET | ORAL | 0 refills | Status: DC

## 2016-07-10 NOTE — Telephone Encounter (Signed)
Pt is requesting diflucan to be called in for a yeast inf.     [facility]

## 2016-07-10 NOTE — Telephone Encounter (Signed)
Spoke with patient and patient stated that she is experiencing vaginal itching, with burning, and white discharge. Per facility protocol Diflucan sent into pharmacy. Patient verbalized understanding.

## 2016-07-17 ENCOUNTER — Other Ambulatory Visit: Payer: Self-pay | Admitting: Family Medicine

## 2016-09-08 ENCOUNTER — Other Ambulatory Visit: Payer: Self-pay | Admitting: Nurse Practitioner

## 2016-10-08 ENCOUNTER — Other Ambulatory Visit: Payer: Self-pay | Admitting: Family Medicine

## 2016-10-08 ENCOUNTER — Other Ambulatory Visit: Payer: Self-pay | Admitting: Nurse Practitioner

## 2016-12-07 ENCOUNTER — Telehealth: Payer: Self-pay | Admitting: Family Medicine

## 2016-12-07 NOTE — Telephone Encounter (Signed)
Patient just found out she is pregnant.  She is wanting to know if it is safe for her to be taking her Rx for amitriptyline and rizatriptan?

## 2016-12-07 NOTE — Telephone Encounter (Signed)
She has to stop both of these. She should also go ahead and get established with OB/GYN in for pregnancy care. Tylenol is permissible to use

## 2016-12-07 NOTE — Telephone Encounter (Signed)
Patient advised Dr Lorin Picket states she has to stop both of these medications(Amitriptyline and Rizatriptan). She should also go ahead and get established with OB/GYN in for pregnancy care. Tylenol is permissible to use. Patient verbalized understanding.
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
## 2017-07-02 ENCOUNTER — Other Ambulatory Visit (HOSPITAL_COMMUNITY): Payer: Self-pay | Admitting: Obstetrics and Gynecology

## 2017-07-02 DIAGNOSIS — O43213 Placenta accreta, third trimester: Secondary | ICD-10-CM

## 2017-07-02 DIAGNOSIS — Z3689 Encounter for other specified antenatal screening: Secondary | ICD-10-CM

## 2017-07-02 DIAGNOSIS — Z3A33 33 weeks gestation of pregnancy: Secondary | ICD-10-CM

## 2017-07-04 ENCOUNTER — Encounter (HOSPITAL_COMMUNITY): Payer: Self-pay | Admitting: *Deleted

## 2017-07-05 ENCOUNTER — Ambulatory Visit (HOSPITAL_COMMUNITY)
Admission: RE | Admit: 2017-07-05 | Discharge: 2017-07-05 | Disposition: A | Discharge status: Home / Self Care | Payer: PRIVATE HEALTH INSURANCE | Source: Ambulatory Visit | Attending: Family Medicine | Admitting: Family Medicine

## 2017-07-05 ENCOUNTER — Encounter (HOSPITAL_COMMUNITY): Payer: Self-pay

## 2017-07-05 DIAGNOSIS — Z3A33 33 weeks gestation of pregnancy: Secondary | ICD-10-CM | POA: Diagnosis not present

## 2017-07-05 DIAGNOSIS — Z3689 Encounter for other specified antenatal screening: Secondary | ICD-10-CM | POA: Diagnosis not present

## 2017-07-05 DIAGNOSIS — O43213 Placenta accreta, third trimester: Secondary | ICD-10-CM

## 2017-07-05 HISTORY — DX: Other specified health status: Z78.9

## 2017-07-05 NOTE — ED Notes (Signed)
Pt here today in [facility] for U/S.  BP 152/101 in left upper arm with large cuff.  169/100 in Rt upper arm with large cuff.  Pt states she "normally" has to have BP to be taken in lower arm due to "false readings".  Pt also c/o blurry vision, swelling in face/feet/hands. Denies HA of epigastric pain.  Pt states she is suppose to start a 24 hour urine tomorrow.

## 2017-07-10 HISTORY — PX: TUBAL LIGATION: SHX77

## 2017-07-11 ENCOUNTER — Encounter (HOSPITAL_COMMUNITY): Payer: Self-pay

## 2017-07-16 MED ORDER — HYDROMORPHONE HCL 1 MG/ML IJ SOLN
1.00 | INTRAMUSCULAR | Status: DC
Start: ? — End: 2017-07-16

## 2017-07-16 MED ORDER — LANOLIN EX OINT
TOPICAL_OINTMENT | CUTANEOUS | Status: DC
Start: ? — End: 2017-07-16

## 2017-07-16 MED ORDER — SALINE NASAL SPRAY 0.65 % NA SOLN
NASAL | Status: DC
Start: ? — End: 2017-07-16

## 2017-07-16 MED ORDER — GENERIC EXTERNAL MEDICATION
Status: DC
Start: ? — End: 2017-07-16

## 2017-07-16 MED ORDER — ENOXAPARIN SODIUM 40 MG/0.4ML ~~LOC~~ SOLN
40.00 | SUBCUTANEOUS | Status: DC
Start: 2017-07-14 — End: 2017-07-16

## 2017-07-16 MED ORDER — MAGNESIUM HYDROXIDE 400 MG/5ML PO SUSP
30.00 | ORAL | Status: DC
Start: ? — End: 2017-07-16

## 2017-07-16 MED ORDER — ANTACID & ANTIGAS 200-200-20 MG/5ML PO SUSP
30.00 | ORAL | Status: DC
Start: ? — End: 2017-07-16

## 2017-07-16 MED ORDER — SIMETHICONE 80 MG PO CHEW
80.00 | CHEWABLE_TABLET | ORAL | Status: DC
Start: ? — End: 2017-07-16

## 2017-07-16 MED ORDER — HYDROCODONE-ACETAMINOPHEN 10-325 MG PO TABS
ORAL_TABLET | ORAL | Status: DC
Start: ? — End: 2017-07-16

## 2017-07-16 MED ORDER — NALBUPHINE HCL 10 MG/ML IJ SOLN
2.00 | INTRAMUSCULAR | Status: DC
Start: ? — End: 2017-07-16

## 2017-07-16 MED ORDER — GENERIC EXTERNAL MEDICATION
800.00 | Status: DC
Start: 2017-07-14 — End: 2017-07-16

## 2017-07-16 MED ORDER — HYDROCODONE-ACETAMINOPHEN 5-325 MG PO TABS
ORAL_TABLET | ORAL | Status: DC
Start: ? — End: 2017-07-16

## 2017-07-16 MED ORDER — GUAIFENESIN 100 MG/5ML PO LIQD
200.00 | ORAL | Status: DC
Start: ? — End: 2017-07-16

## 2017-07-16 MED ORDER — IBUPROFEN 800 MG PO TABS
800.00 | ORAL_TABLET | ORAL | Status: DC
Start: ? — End: 2017-07-16

## 2017-07-16 MED ORDER — NIFEDIPINE ER 60 MG PO TB24
60.00 | ORAL_TABLET | ORAL | Status: DC
Start: 2017-07-14 — End: 2017-07-16

## 2017-07-16 MED ORDER — ONDANSETRON HCL 4 MG/2ML IJ SOLN
4.00 | INTRAMUSCULAR | Status: DC
Start: ? — End: 2017-07-16

## 2017-07-16 MED ORDER — OXYCODONE HCL 10 MG PO TABS
10.00 | ORAL_TABLET | ORAL | Status: DC
Start: ? — End: 2017-07-16

## 2017-07-16 MED ORDER — DOCUSATE SODIUM 100 MG PO CAPS
100.00 | ORAL_CAPSULE | ORAL | Status: DC
Start: 2017-07-14 — End: 2017-07-16

## 2017-07-16 MED ORDER — PRENATAL VITAMINS 28-0.8 MG PO TABS
ORAL_TABLET | ORAL | Status: DC
Start: 2017-07-15 — End: 2017-07-16

## 2017-07-16 MED ORDER — MORPHINE SULFATE (PF) 10 MG/ML IV SOLN
10.00 | INTRAVENOUS | Status: DC
Start: ? — End: 2017-07-16

## 2017-07-16 MED ORDER — MORPHINE SULFATE (PF) 2 MG/ML IV SOLN
2.00 | INTRAVENOUS | Status: DC
Start: ? — End: 2017-07-16

## 2017-07-16 MED ORDER — MORPHINE SULFATE (PF) 4 MG/ML IV SOLN
4.00 | INTRAVENOUS | Status: DC
Start: ? — End: 2017-07-16

## 2017-07-16 MED ORDER — TETANUS-DIPHTH-ACELL PERTUSSIS 5-2-15.5 LF-MCG/0.5 IM SUSP
.50 | INTRAMUSCULAR | Status: DC
Start: ? — End: 2017-07-16

## 2017-07-16 MED ORDER — BISACODYL 10 MG RE SUPP
10.00 | RECTAL | Status: DC
Start: ? — End: 2017-07-16

## 2017-07-16 MED ORDER — BENZOCAINE-MENTHOL 15-3.6 MG MT LOZG
LOZENGE | OROMUCOSAL | Status: DC
Start: ? — End: 2017-07-16

## 2017-07-16 MED ORDER — MEASLES, MUMPS & RUBELLA VAC ~~LOC~~ INJ
0.50 | INJECTION | SUBCUTANEOUS | Status: DC
Start: ? — End: 2017-07-16

## 2017-07-16 MED ORDER — BENZOCAINE-MENTHOL 20-0.5 % EX AERO
INHALATION_SPRAY | CUTANEOUS | Status: DC
Start: ? — End: 2017-07-16

## 2017-07-16 MED ORDER — ACETAMINOPHEN 325 MG PO TABS
650.00 | ORAL_TABLET | ORAL | Status: DC
Start: ? — End: 2017-07-16

## 2017-07-16 MED ORDER — IBUPROFEN 800 MG PO TABS
800.00 | ORAL_TABLET | ORAL | Status: DC
Start: 2017-07-15 — End: 2017-07-16

## 2017-09-24 ENCOUNTER — Encounter: Payer: Self-pay | Admitting: Family Medicine

## 2017-09-24 ENCOUNTER — Ambulatory Visit (INDEPENDENT_AMBULATORY_CARE_PROVIDER_SITE_OTHER): Payer: PRIVATE HEALTH INSURANCE | Admitting: Family Medicine

## 2017-09-24 VITALS — BP 138/94 | Temp 98.3°F | Ht 63.0 in | Wt 266.1 lb

## 2017-09-24 DIAGNOSIS — J310 Chronic rhinitis: Secondary | ICD-10-CM

## 2017-09-24 DIAGNOSIS — J329 Chronic sinusitis, unspecified: Secondary | ICD-10-CM | POA: Diagnosis not present

## 2017-09-24 MED ORDER — AMOXICILLIN 500 MG PO CAPS
500.0000 mg | ORAL_CAPSULE | Freq: Three times a day (TID) | ORAL | 0 refills | Status: DC
Start: 2017-09-24 — End: 2018-09-23

## 2017-09-24 NOTE — Progress Notes (Signed)
   Subjective:    Patient ID: Cynthia CorningMindy R [name], female    DOB: 09/04/1985, 32 y.o.   MRN: [id number]  HPI Patient is here today with complaints of a productive cough,chest congestion,head congestion,sore throat,bilatreral ear pain, all since Friday. She has been taking Tylenol which has helped some.  Others in house had symotos and viruses  wokeup Friday with bad sore throat Nofever  Right   Sore throatall day  Energy level down   Did flu shot n  Positive productive cough   Frontal headache worse with, coughing      Review of Systems No headache, no major weight loss or weight gain, no chest pain no back pain abdominal pain no change in bowel habits complete ROS otherwise negative     Objective:   Physical Exam  SoAlert, mild malaise. Hydration good Vitals stable. frontal/ maxillary tenderness evident positive nasal congestion. pharynx normal neck supple  lungs clear/no crackles or wheezes. heart regular in rhythm       Assessment & Plan:  Impression rhinosinusitis likely post viral, discussed with patient. plan antibiotics prescribed. Questions answered. Symptomatic care discussed. warning signs discussed. WSL

## 2017-10-04 ENCOUNTER — Telehealth (HOSPITAL_COMMUNITY): Payer: Self-pay | Admitting: Lactation Services

## 2017-10-04 NOTE — Telephone Encounter (Signed)
Attempted to return mom's call on the phone line. Mom reports her infant is feeding a lot and also needing bottles to supplement afterwards. Mom did not answer. LM for mom to call back.

## 2017-10-04 NOTE — Telephone Encounter (Signed)
Mom reports that infant was born at [facility] and was in the NICU on 07/10/2017. He was born at 34 weeks and 2 days. BW 5 lb 11 oz with lowest weight 5 lb 8 oz at d/c. This is her 3rd child.   Infant initially latched well in the NICU and was being supplemented with 20 ml pumped milk after BF.   Infant with good weight gain and supplement was stopped after some weight checks.   Mom is noticing that infant seems to be hungry a lot. He was nursing with feeding cues and still seems to be hungry. Mom started leaving infant with the sitter and dad and infant more content and fell asleep . Mom reports infant fussy with her and does better after having bottle than when BF.   Mom is pumping 2 x a day and getting 4 oz with Spectra S2. Mom is taking Pump Princess from [employer] some and she does notice a difference in her supply and has taken Fenugreek when she remembers.   Mom is concerned that latch is not good. She denies nipple pain with feeding. Mom does not feel she has a milk supply problem.   Mom is currently only BF when she is home and infant eats about every 3 hours. Mom reports infant acts hungry after feeding but not with every feeding. It seems he is more hungry after BF more often than not. Mom is offering a bottle if infant is still acting hungry. Enc mom to make sure she pumps when infant receives a bottle.   Infant is voiding and stooling with most feedings. [facility] and was up to 8 lb 5 oz.   Mom to return to work about Feb 18th and reports infant will be getting more bottles at that time.    Mom reports this is her third child and his feeding are sometimes interrupted due to activities and having to pick up older children. Discussed that elongating times between feeds can effect total feedings in a day and can send message to brain to slow down milk production. Mom gave examples that today she did not feed him at 9 when he was  cueing as she had to take middle child to preschool. She reports this afternoon he was in the middle of feeding and she had to stop it and go pick up older child from school. Enc mom to feed infant with feeding cues or wake him up a little early to feed him before leaving.    Mom report she has not seen a LC and there is little support in there area. [facility] aware [facility].   Mom not able to get a current weight check. Mom agreeable to OP appt for feeding assessment. In basket message to OP clinic to call mom and schedule OP Lactation appt.

## 2017-11-11 ENCOUNTER — Encounter (HOSPITAL_COMMUNITY): Payer: Self-pay

## 2018-01-09 ENCOUNTER — Ambulatory Visit: Payer: PRIVATE HEALTH INSURANCE | Admitting: Family Medicine

## 2018-01-09 ENCOUNTER — Encounter: Payer: Self-pay | Admitting: Family Medicine

## 2018-01-09 VITALS — Temp 98.2°F | Ht 63.0 in | Wt 270.0 lb

## 2018-01-09 DIAGNOSIS — R21 Rash and other nonspecific skin eruption: Secondary | ICD-10-CM | POA: Diagnosis not present

## 2018-01-09 NOTE — Progress Notes (Signed)
   Subjective:    Patient ID: Cynthia [name], female    DOB: 11/05/1985, 32 y.o.   MRN: [id number]  HPI   Patient  Arrives today with complaints of a rash on both sides of her stomach and under her right arm. It itches and burns and is raised, she noticed this two days ago. She also has some blisters on her right hand that she noticed this night. She has also had a headache the last two days.She is breast feeding and concerned about taking just anything for this.She has not taken any medications for this.  Monday at work noticed itching, saw a little rash  Was not sure cause, had one tick recently, some possible mosquito bites  Then developed a patch on the flank and uncomfortable  Then startd expanding  Cont to worsen     Ne g ot c medsbr feding nw, worried aobut pos  Spread   Review of Systems No headache, no major weight loss or weight gain, no chest pain no back pain abdominal pain no change in bowel habits complete ROS otherwise negative     Objective:   Physical Exam  Alert vitals stable, NAD. Blood pressure good on repeat. HEENT normal. Lungs clear. Heart regular rate and rhythm. Diffuse maculopapular rash patches both flanks and trunk/completely blanchable/clearly pruritic/not true hives      Assessment & Plan:  Impression rash Long discussion held exact etiology uncertain.  Noninfectious would use topical steroids or antihistamines due to breast-feeding status

## 2018-06-10 ENCOUNTER — Telehealth: Payer: Self-pay | Admitting: Family Medicine

## 2018-06-10 NOTE — Telephone Encounter (Signed)
Patient wanting to know if its safe to take her migraine medication rizatriptan 10 mg while breast feeding.

## 2018-06-12 NOTE — Telephone Encounter (Signed)
The experts have stated not enough know whether it causes harm or not, recommended to avoid if at all possible,

## 2018-06-12 NOTE — Telephone Encounter (Signed)
I called and left a message asked that she r/c. 

## 2018-06-12 NOTE — Telephone Encounter (Signed)
Discussed with pt. Pt verbalized understanding.  °

## 2018-09-23 ENCOUNTER — Ambulatory Visit: Payer: PRIVATE HEALTH INSURANCE | Admitting: Family Medicine

## 2018-09-23 ENCOUNTER — Encounter: Payer: Self-pay | Admitting: Family Medicine

## 2018-09-23 VITALS — Temp 98.1°F | Wt 273.0 lb

## 2018-09-23 DIAGNOSIS — B349 Viral infection, unspecified: Secondary | ICD-10-CM

## 2018-09-23 DIAGNOSIS — L509 Urticaria, unspecified: Secondary | ICD-10-CM | POA: Diagnosis not present

## 2018-09-23 MED ORDER — FAMOTIDINE 40 MG PO TABS: 40.0000 mg | ORAL_TABLET | Freq: Every day | ORAL | 0 refills | Status: DC

## 2018-09-23 MED ORDER — LORATADINE 10 MG PO TABS: 10.0000 mg | ORAL_TABLET | Freq: Every day | ORAL | 0 refills | Status: DC

## 2018-09-23 NOTE — Patient Instructions (Signed)
Take 1 pepcid (famotadine) 40 mg tablet daily and 1 claritin (loratadine) 10 mg tablet daily for the next 2 weeks. May continue benadryl at night if needed. Follow up if hives worsen or fail to improve with these measures.    Viral Respiratory Infection A viral respiratory infection is an illness that affects parts of the body that are used for breathing. These include the lungs, nose, and throat. It is caused by a germ called a virus. Some examples of this kind of infection are:  A cold.  The flu (influenza).  A respiratory syncytial virus (RSV) infection. A person who gets this illness may have the following symptoms:  A stuffy or runny nose.  Yellow or green fluid in the nose.  A cough.  Sneezing.  Tiredness (fatigue).  Achy muscles.  A sore throat.  Sweating or chills.  A fever.  A headache. Follow these instructions at home: Managing pain and congestion  Take over-the-counter and prescription medicines only as told by your doctor.  If you have a sore throat, gargle with salt water. Do this 3-4 times per day or as needed. To make a salt-water mixture, dissolve -1 tsp of salt in 1 cup of warm water. Make sure that all the salt dissolves.  Use nose drops made from salt water. This helps with stuffiness (congestion). It also helps soften the skin around your nose.  Drink enough fluid to keep your pee (urine) pale yellow. General instructions   Rest as much as possible.  Do not drink alcohol.  Do not use any products that have nicotine or tobacco, such as cigarettes and e-cigarettes. If you need help quitting, ask your doctor.  Keep all follow-up visits as told by your doctor. This is important. How is this prevented?   Get a flu shot every year. Ask your doctor when you should get your flu shot.  Do not let other people get your germs. If you are sick: ? Stay home from work or school. ? Wash your hands with soap and water often. Wash your hands after you  cough or sneeze. If soap and water are not available, use hand sanitizer.  Avoid contact with people who are sick during cold and flu season. This is in fall and winter. Get help if:  Your symptoms last for 10 days or longer.  Your symptoms get worse over time.  You have a fever.  You have very bad pain in your face or forehead.  Parts of your jaw or neck become very swollen. Get help right away if:  You feel pain or pressure in your chest.  You have shortness of breath.  You faint or feel like you will faint.  You keep throwing up (vomiting).  You feel confused. Summary  A viral respiratory infection is an illness that affects parts of the body that are used for breathing.  Examples of this illness include a cold, the flu, and respiratory syncytial virus (RSV) infection.  The infection can cause a runny nose, cough, sneezing, sore throat, and fever.  Follow what your doctor tells you about taking medicines, drinking lots of fluid, washing your hands, resting at home, and avoiding people who are sick. This information is not intended to replace advice given to you by your health care provider. Make sure you discuss any questions you have with your health care provider. Document Released: 07/27/2008 Document Revised: 09/24/2017 Document Reviewed: 09/24/2017 Elsevier Interactive Patient Education  2019 [employer].

## 2018-09-23 NOTE — Progress Notes (Addendum)
Subjective:    Patient ID: Cynthia CorningMindy R [name], female    DOB: [birth year], 33 y.o.   MRN: [id number]  Cough  This is a new problem. The current episode started 1 to 4 weeks ago. The cough is productive of sputum. Associated symptoms include a fever, headaches, rhinorrhea and a sore throat. Pertinent negatives include no shortness of breath or wheezing. Associated symptoms comments: Body aches; low grade fever. Treatments tried: Benadryl; Ibuprofen     Pt also states that she has been having hives for about a week. 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 Had sushi last Sunday and she is usually able to eat sushi but this time broke out in rash. Pt states they come and go; from chest to upper thigh, only on pulse spots on arm, and neck, hands, had some on her face. Reports significant itching. Pt states she gets hives about 2 times a day. Usually not until evening time. Reports takes benadryl only at night.   Then 3-4 days ago started with cough and sore throat and h/a. Reports low grade fever occasionally. Feels like symptoms are getting worse. Denies sob or wheezing.   Reports 3 sons are all sick at home, 2 with bronchiolitis.   Currently breastfeeding.   Review of Systems  Constitutional: Positive for fever.  HENT: Positive for rhinorrhea and sore throat. Negative for trouble swallowing.   Respiratory: Positive for cough. Negative for shortness of breath and wheezing.   Neurological: Positive for headaches.       Objective:   Physical Exam Vitals signs and nursing note reviewed.  Constitutional:      General: She is not in acute distress.    Appearance: Normal appearance. She is not toxic-appearing.  HENT:     Head: Normocephalic and atraumatic.     Right Ear: Tympanic membrane normal.     Left Ear: Tympanic membrane normal.     Nose: Nose normal.     Mouth/Throat:     Mouth: Mucous  membranes are moist.     Pharynx: Oropharynx is clear.  Eyes:     General:        Right eye: No discharge.        Left eye: No discharge.  Neck:     Musculoskeletal: Neck supple. No neck rigidity.  Cardiovascular:     Rate and Rhythm: Normal rate and regular rhythm.     Heart sounds: Normal heart sounds.  Pulmonary:     Effort: Pulmonary effort is normal. No respiratory distress.     Breath sounds: Normal breath sounds.  Lymphadenopathy:     Cervical: No cervical adenopathy.  Skin:    General: Skin is warm and dry.     Findings: No rash.  Neurological:     Mental Status: She is alert and oriented to person, place, and time.           Assessment & Plan:  1. Hives Pt without hives currently, but from description and pictures most likely sounds like hives. Will treat with daily pepcid and claritin use x 2 weeks, may continue prn benadryl at nighttime. If hives do not resolve or get worse over the next 2 weeks should f/u. Warning signs discussed. Pt declines offer of allergy referral at this time.  2. Acute viral syndrome Discussed likely viral etiology at this time, possible flu, antibiotics not warranted. Symptomatic care discussed, warning  signs discussed. F/u if symptoms worsen or fail to improve.   Dr. Lubertha SouthSteve Luking was consulted on this case and is in agreement with the above treatment plan.

## 2018-10-20 ENCOUNTER — Other Ambulatory Visit: Payer: Self-pay | Admitting: Family Medicine

## 2019-02-24 ENCOUNTER — Other Ambulatory Visit: Payer: PRIVATE HEALTH INSURANCE

## 2019-02-24 ENCOUNTER — Other Ambulatory Visit: Payer: Self-pay | Admitting: Internal Medicine

## 2019-02-24 DIAGNOSIS — Z20822 Contact with and (suspected) exposure to covid-19: Secondary | ICD-10-CM

## 2019-02-27 LAB — NOVEL CORONAVIRUS, NAA: SARS-CoV-2, NAA: NOT DETECTED

## 2019-06-17 ENCOUNTER — Encounter: Payer: Self-pay | Admitting: General Surgery

## 2019-06-17 ENCOUNTER — Ambulatory Visit (INDEPENDENT_AMBULATORY_CARE_PROVIDER_SITE_OTHER): Payer: PRIVATE HEALTH INSURANCE | Admitting: General Surgery

## 2019-06-17 ENCOUNTER — Other Ambulatory Visit: Payer: Self-pay

## 2019-06-17 VITALS — BP 178/124 | HR 74 | Temp 97.1°F | Resp 16 | Ht 63.0 in | Wt 285.0 lb

## 2019-06-17 DIAGNOSIS — K429 Umbilical hernia without obstruction or gangrene: Secondary | ICD-10-CM

## 2019-06-17 NOTE — Patient Instructions (Signed)
Laparoscopic Ventral Hernia Repair Laparoscopic ventral hernia repairis a procedure to fix a bulge of tissue that pushes through a weak area of muscle in the abdomen (ventral hernia). A ventral hernia may be at the belly button (umbilical), above the belly button (epigastric), or at the incision site from previous abdominal surgery (incisional hernia). You may have this procedure as emergency surgery if part of your intestine gets trapped inside the hernia and starts to lose its blood supply (strangulation). Laparoscopic surgery is done through small incisions using a thin surgical telescope with a light and camera on the end (laparoscope). During surgery, your surgeon will use images from the laparoscope to guide the procedure. A mesh screen will be placed in the hernia to close the opening and strengthen the abdominal wall. Tell a health care provider about:  Any allergies you have.  All medicines you are taking, including vitamins, herbs, eye drops, creams, and over-the-counter medicines.  Any problems you or family members have had with anesthetic medicines.  Any blood disorders you have.  Any surgeries you have had.  Any medical conditions you have.  Whether you are pregnant or may be pregnant. What are the risks? Generally, this is a safe procedure. However, problems may occur, including:  Infection.  Bleeding.  Allergic reactions to medicines.  Damage to other structures or organs in the abdomen.  Trouble urinating or having a bowel movement after surgery.  Pneumonia.  Blood clots.  The hernia coming back after surgery.  Fluid buildup in the area of the hernia. In some cases, your health care provider may need to switch from a laparoscopic procedure to a procedure that is done through a single, larger incision in the abdomen (open procedure). You may need an open procedure if:  You have a hernia that is difficult to repair.  Your organs are hard to see.  You have  bleeding problems during the laparoscopic procedure. What happens before the procedure?  Diastasis Recti  Diastasis recti is when the muscles of the abdomen (rectus abdominis muscles) become thin and separate. The result is a wider space between the right and left abdomen (abdominal) muscles. This wider space between the muscles may cause a bulge in the middle of your abdomen. You may notice this bulge when you are straining or when you sit up from a lying down position. Diastasis recti can affect men and women. It is most common among pregnant women, infants, people who are obese, and people who have had abdominal surgery. Exercise or surgical treatment may help correct it. What are the causes? Common causes of this condition include:  Pregnancy. The growing uterus puts pressure on the abdominal muscles, which causes the muscles to separate.  Obesity. Excess fat puts pressure on abdominal muscles.  Weightlifting.  Some abdomen exercises.  Advanced age.  Genetics.  Prior abdominal surgery. What increases the risk? This condition is more likely to develop in:  Women.  Newborns, especially newborns who are born early (prematurely). What are the signs or symptoms? Common symptoms of this condition include:  A bulge in the middle of the abdomen. You will notice it most when you sit up or strain.  Pain in the low back, pelvis, or hips.  Constipation.  Inability to control when you urinate (urinary incontinence).  Bloating.  Poor posture. How is this diagnosed? This condition is diagnosed with a physical exam. Your health care provider will ask you to lie flat on your back and do a crunch or half sit-up.  If you have diastasis recti, a vertical bulge will appear between your abdominal muscles in the center of your abdomen. Your health care provider will measure the gap between your muscles with one of the following:  A medical device used to measure the space between two  objects (caliper).  A tape measure.  CT scan.  Ultrasound.  Finger spaces. Your health care provider will measure the space using their fingers. How is this treated? If your muscle separation is not too large, you may not need treatment. However, if you are a woman who plans to become pregnant again, you should treat this condition before your next pregnancy. Treatment may include:  Physical therapy to strengthen and tighten your abdominal muscles.  Lifestyle changes such as weight loss and exercise.  Over-the-counter pain medicines as needed.  Surgery to correct the separation. Follow these instructions at home: Activity  Return to your normal activities as told by your health care provider. Ask your health care provider what activities are safe for you.  When lifting weights or doing exercises using your abdominal muscles or the muscles in the center of your body that give stability (core muscles), make sure you are doing your exercises and movements correctly. Proper form can help to prevent the condition from happening again. General instructions  If you are overweight, ask your health care provider for help with weight loss. Losing even a small amount of weight can help to improve your diastasis recti.  Take over-the-counter or prescription medicines only as told by your health care provider.  Do not strain. Straining can make the separation worse. Examples of straining include: ? Pushing hard to have a bowel movement, such as due to constipation. ? Lifting heavy objects, including children. ? Standing up and sitting down.  Take steps to prevent constipation: ? Drink enough fluid to keep your urine clear or pale yellow. ? Take over-the-counter or prescription medicines only as directed. ? Eat foods that are high in fiber, such as fresh fruits and vegetables, whole grains, and beans. ? Limit foods that are high in fat and processed sugars, such as fried and sweet foods.  Contact a health care provider if:  You notice a new bulge in your abdomen. Get help right away if:  You experience severe discomfort in your abdomen.  You develop severe abdominal pain along with nausea, vomiting, or fever. Summary  Diastasis recti is when the abdomen (abdominal) muscles become thin and separate. Your abdomen will stick out because the space between your right and left abdomen muscles has widened.  The most common symptom is a bulge in your abdomen. You will notice it most when you sit up or are straining.  This condition is diagnosed during a physical exam.  If the abdomen separation is not too big, you may choose not to have treatment. Otherwise, you may need to undergo physical therapy or surgery. This information is not intended to replace advice given to you by your health care provider. Make sure you discuss any questions you have with your health care provider. Document Released: 10/09/2016 Document Revised: 07/27/2017 Document Reviewed: 10/09/2016 Elsevier Patient Education  2020 [employer]. Medicines  Ask your health care provider about: ? Changing or stopping your regular medicines. This is especially important if you are taking diabetes medicines or blood thinners. ? Taking medicines such as aspirin and ibuprofen. These medicines can thin your blood. Do not take these medicines before your procedure if your health care provider instructs you not  to.  You may be given antibiotic medicine to help prevent infection. General instructions   You may be asked to take a laxative or do an enema to empty your bowel before surgery (bowel prep).  Do not use any products that contain nicotine or tobacco, such as cigarettes and e-cigarettes. If you need help quitting, ask your health care provider.  You may need to have tests before the procedure, such as: ? Blood tests. ? Urine tests. ? Abdominal ultrasound. ? Chest X-ray. ? Electrocardiogram (ECG).  Plan  to have someone take you home from the hospital or clinic.  If you will be going home right after the procedure, plan to have someone with you for 24 hours. What happens during the procedure?  To reduce your risk of infection: ? Your health care team will wash or sanitize their hands. ? Your skin will be washed with soap.  An IV tube will be inserted into one of your veins.  You will be given one or more of the following: ? A medicine to help you relax (sedative). ? A medicine to make you fall asleep (general anesthetic).  A small incision will be made in your abdomen. A hollow metal tube (trocar) will be placed through the incision.  A tube will be placed through the trocar to inflate your abdomen with air-like gas. This makes it easier for your surgeon to see inside your abdomen and do the repair.  The laparoscope will be inserted into your abdomen through the trocar. The laparoscope will send images to a monitor in the operating room.  Other trocars will be put through other small incisions in your abdomen. The surgical instruments needed for the procedure will be placed through these trocars.  The tissue or intestines that make up the hernia will be moved back into place.  The edges of the hernia may be stitched together.  A piece of mesh will be used to close the hernia. Stitches (sutures), clips, or staples will be used to keep the mesh in place.  A bandage (dressing) or skin glue will be put over the incisions. The procedure may vary among health care providers and hospitals. What happens after the procedure?  Your blood pressure, heart rate, breathing rate, and blood oxygen level will be monitored until the medicines you were given have worn off.  You will continue to receive fluids and medicines through an IV tube. Your IV tube will be removed when you can drink clear fluids.  You will be given pain medicine as needed.  You will be encouraged to get up and walk around  as soon as possible.  You may have to wear compression stockings. These stockings help to prevent blood clots and reduce swelling in your legs.  You will be shown how to do deep breathing exercises to help prevent a lung infection.  Do not drive for 24 hours if you were given a sedative. This information is not intended to replace advice given to you by your health care provider. Make sure you discuss any questions you have with your health care provider. Document Released: 07/31/2012 Document Revised: 07/27/2017 Document Reviewed: 03/31/2016 Elsevier Patient Education  2020 [employer] Ventral Hernia  A ventral hernia is a bulge of tissue from inside the abdomen that pushes through a weak area of the muscles that form the front wall of the abdomen. The tissues inside the abdomen are inside a sac (peritoneum). These tissues include the small intestine, large intestine, and the  fatty tissue that covers the intestines (omentum). Sometimes, the bulge that forms a hernia contains intestines. Other hernias contain only fat. Ventral hernias do not go away without surgical treatment. There are several types of ventral hernias. You may have:  A hernia at an incision site from previous abdominal surgery (incisional hernia).  A hernia just above the belly button (epigastric hernia), or at the belly button (umbilical hernia). These types of hernias can develop from heavy lifting or straining.  A hernia that comes and goes (reducible hernia). It may be visible only when you lift or strain. This type of hernia can be pushed back into the abdomen (reduced).  A hernia that traps abdominal tissue inside the hernia (incarcerated hernia). This type of hernia does not reduce.  A hernia that cuts off blood flow to the tissues inside the hernia (strangulated hernia). The tissues can start to die if this happens. This is a very painful bulge that cannot be reduced. A strangulated hernia is a medical emergency.  What are the causes? This condition is caused by abdominal tissue putting pressure on an area of weakness in the abdominal muscles. What increases the risk? The following factors may make you more likely to develop this condition:  Being female.  Being 47 or older.  Being overweight or obese.  Having had previous abdominal surgery, especially if there was an infection after surgery.  Having had an injury to the abdominal wall.  Having had several pregnancies.  Having a buildup of fluid inside the abdomen (ascites). What are the signs or symptoms? The only symptom of a ventral hernia may be a painless bulge in the abdomen. A reducible hernia may be visible only when you strain, cough, or lift. Other symptoms may include:  Dull pain.  A feeling of pressure. Signs and symptoms of a strangulated hernia may include:  Increasing pain.  Nausea and vomiting.  Pain when pressing on the hernia.  The skin over the hernia turning red or purple.  Constipation.  Blood in the stool (feces). How is this diagnosed? This condition may be diagnosed based on:  Your symptoms.  Your medical history.  A physical exam. You may be asked to cough or strain while standing. These actions increase the pressure inside your abdomen and force the hernia through the opening in your muscles. Your health care provider may try to reduce the hernia by pressing on it.  Imaging studies, such as an ultrasound or CT scan. How is this treated? This condition is treated with surgery. If you have a strangulated hernia, surgery is done as soon as possible. If your hernia is small and not incarcerated, you may be asked to lose some weight before surgery. Follow these instructions at home:  Follow instructions from your health care provider about eating or drinking restrictions.  If you are overweight, your health care provider may recommend that you increase your activity level and eat a healthier diet.  Do  not lift anything that is heavier than 10 lb (4.5 kg).  Return to your normal activities as told by your health care provider. Ask your health care provider what activities are safe for you. You may need to avoid activities that increase pressure on your hernia.  Take over-the-counter and prescription medicines only as told by your health care provider.  Keep all follow-up visits as told by your health care provider. This is important. Contact a health care provider if:  Your hernia gets larger.  Your hernia becomes painful.  Get help right away if:  Your hernia becomes increasingly painful.  You have pain along with any of the following: ? Changes in skin color in the area of the hernia. ? Nausea. ? Vomiting. ? Fever. Summary  A ventral hernia is a bulge of tissue from inside the abdomen that pushes through a weak area of the muscles that form the front wall of the abdomen.  This condition is treated with surgery, which may be urgent depending on your hernia.  Do not lift anything that is heavier than 10 lb (4.5 kg), and follow activity instructions from your health care provider. This information is not intended to replace advice given to you by your health care provider. Make sure you discuss any questions you have with your health care provider. Document Released: 07/31/2012 Document Revised: 09/26/2017 Document Reviewed: 03/05/2017 Elsevier Patient Education  2020 [employer].

## 2019-06-18 NOTE — Progress Notes (Signed)
Cynthia [name]; [id number]; [birth year]   HPI Patient is a 33 year old white female who was referred to my care for evaluation treatment of abdominal wall hernias.  Patient has had intermittent periumbilical abdominal pain with swelling for many months.  She also was pregnant in the last 2 years and had noticeable pain in the groin regions.  She was diagnosed with bilateral inguinal hernias, though her symptoms have been less since delivery.  She was lifting something heavy several months ago and had right groin pain.  She has never noticed a lump.  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  She currently has no pain.  She has no nausea or vomiting.  She was seen by another surgeon but did not undergo imaging. Past Medical History:  Diagnosis Date  . Medical history non-contributory     Past Surgical History:  Procedure Laterality Date  . CESAREAN SECTION      History reviewed. No pertinent family history.  Current Outpatient Medications on File Prior to Visit  Medication Sig Dispense Refill  . rizatriptan (MAXALT) 10 MG tablet Take by mouth.    . topiramate (TOPAMAX) 100 MG tablet Take 100 mg by mouth 2 (two) times daily.    . famotidine (PEPCID) 40 MG tablet Take 1 tablet (40 mg total) by mouth daily. (Patient not taking: Reported on 06/17/2019) 30 tablet 0  . loratadine (CLARITIN) 10 MG tablet TAKE 1 TABLET BY MOUTH EVERY DAY (Patient not taking: Reported on 06/17/2019) 90 tablet 1   No current facility-administered medications on file prior to visit.     Allergies  Allergen Reactions  . Anchovies [Fish Allergy]   . Cefzil [Cefprozil]     Metal taste in mouth  . Erythromycin   . Topamax [Topiramate] Other (See Comments)    Difficulty focusing and speaking  . Inderal [Propranolol] Other (See Comments)    Fatigue and jitteriness    Social History   Substance and Sexual Activity  Alcohol Use No  . Frequency: Never    Social History    Tobacco Use  Smoking Status Never Smoker  Smokeless Tobacco Never Used    Review of Systems  Constitutional: Negative.   HENT: Negative.   Eyes: Positive for blurred vision.  Respiratory: Negative.   Cardiovascular: Negative.   Gastrointestinal: Negative.   Genitourinary: Negative.   Musculoskeletal: Negative.   Skin: Negative.   Neurological: Negative.   Endo/Heme/Allergies: Negative.   Psychiatric/Behavioral: Negative.     Objective   Vitals:   06/17/19 1435  BP: (!) 178/124  Pulse: 74  Resp: 16  Temp: (!) 97.1 F (36.2 C)  SpO2: 98%    Physical Exam Vitals signs reviewed.  Constitutional:      Appearance: Normal appearance. She is obese. She is not ill-appearing.  HENT:     Head: Normocephalic and atraumatic.  Cardiovascular:     Rate and Rhythm: Normal rate and regular rhythm.     Heart sounds: Normal heart sounds. No murmur. No friction rub. No gallop.   Pulmonary:     Effort: Pulmonary effort is normal. No respiratory distress.     Breath sounds: Normal breath sounds. No stridor. No wheezing, rhonchi or rales.  Abdominal:     General: Bowel sounds are normal. There is no distension.     Palpations: Abdomen is soft. There is no mass.     Tenderness: There is no abdominal tenderness. There is no guarding or rebound.  Hernia: A hernia is present.     Comments: Diastases is present with a reducible umbilical hernia.  She had laxity of both inguinal regions, but I could not definitively feel inguinal hernias.  This was due to body habitus.  Skin:    General: Skin is warm and dry.  Neurological:     Mental Status: She is alert and oriented to person, place, and time.   Primary care notes reviewed  Assessment  Umbilical hernia, diastases recti, right groin pain Plan   I told patient that she could have inguinal hernias, though I would not suggest surgical exploration of the inguinal hernias.  Imaging could be done, though she does not appear to be  symptomatic enough to require surgical repair.  Imaging for inguinal hernias can be variable.  I would proceed with a laparoscopic ventral herniorrhaphy with mesh to repair her umbilical hernia.  She understands that her diastases is a condition that is not amenable to surgical repair.  The risks and benefits of the procedure including bleeding, infection, mesh use, the possibility of recurrence of the hernia were fully explained to the patient, who gave informed consent.  She will call to schedule the surgery.

## 2019-11-06 ENCOUNTER — Ambulatory Visit: Payer: PRIVATE HEALTH INSURANCE

## 2020-01-20 ENCOUNTER — Ambulatory Visit: Payer: BC Managed Care – PPO | Attending: Internal Medicine

## 2020-01-20 ENCOUNTER — Other Ambulatory Visit: Payer: Self-pay

## 2020-01-20 DIAGNOSIS — Z20822 Contact with and (suspected) exposure to covid-19: Secondary | ICD-10-CM | POA: Insufficient documentation

## 2020-01-22 LAB — SARS-COV-2, NAA 2 DAY TAT

## 2020-01-22 LAB — NOVEL CORONAVIRUS, NAA: SARS-CoV-2, NAA: NOT DETECTED

## 2020-05-23 ENCOUNTER — Encounter: Payer: Self-pay | Admitting: Emergency Medicine

## 2020-05-23 ENCOUNTER — Ambulatory Visit
Admission: EM | Admit: 2020-05-23 | Discharge: 2020-05-23 | Disposition: A | Discharge status: Home / Self Care | Payer: BC Managed Care – PPO | Attending: Emergency Medicine | Admitting: Emergency Medicine

## 2020-05-23 ENCOUNTER — Other Ambulatory Visit: Payer: Self-pay

## 2020-05-23 DIAGNOSIS — J069 Acute upper respiratory infection, unspecified: Secondary | ICD-10-CM

## 2020-05-23 DIAGNOSIS — Z1152 Encounter for screening for COVID-19: Secondary | ICD-10-CM

## 2020-05-23 HISTORY — DX: Migraine, unspecified, not intractable, without status migrainosus: G43.909

## 2020-05-23 MED ORDER — FLUTICASONE PROPIONATE 50 MCG/ACT NA SUSP: 1.0000 | Freq: Every day | NASAL | 0 refills | Status: DC

## 2020-05-23 MED ORDER — CETIRIZINE HCL 10 MG PO TABS: 10.0000 mg | ORAL_TABLET | Freq: Every day | ORAL | 0 refills | Status: DC

## 2020-05-23 MED ORDER — DEXAMETHASONE 4 MG PO TABS: 4.0000 mg | ORAL_TABLET | Freq: Every day | ORAL | 0 refills | Status: AC

## 2020-05-23 NOTE — ED Triage Notes (Signed)
Pt reports chills and body aches last night, temp got up to 102. Swollen glands on neck and LT ear pain.

## 2020-05-23 NOTE — ED Provider Notes (Signed)
[facility]   [id number] 05/23/20 Arrival Time: 1520   CC: COVID symptoms  SUBJECTIVE: History from: patient.  SYNIA DOUGLASS is a 34 y.o. female who presented to urgent care for complaint of chills, fever, body aches, congestion ear pain for the past 1 day.  Denies sick exposure to COVID, flu or strep.  Denies recent travel.  Has tried OTC medication without relief.  Denies aggravating factors.  Denies previous symptoms in the past.   Denies fever, chills, fatigue, sinus pain, rhinorrhea, sore throat, SOB, wheezing, chest pain, nausea, changes in bowel or bladder habits.    .  ROS: As per HPI.  All other pertinent ROS negative.     Past Medical History:  Diagnosis Date  . Medical history non-contributory   . Migraines    Past Surgical History:  Procedure Laterality Date  . CESAREAN SECTION     Allergies  Allergen Reactions  . Anchovies [Fish Allergy]   . Cefzil [Cefprozil]     Metal taste in mouth  . Erythromycin   . Topamax [Topiramate] Other (See Comments)    Difficulty focusing and speaking  . Inderal [Propranolol] Other (See Comments)    Fatigue and jitteriness   No current facility-administered medications on file prior to encounter.   Current Outpatient Medications on File Prior to Encounter  Medication Sig Dispense Refill  . famotidine (PEPCID) 40 MG tablet Take 1 tablet (40 mg total) by mouth daily. (Patient not taking: Reported on 06/17/2019) 30 tablet 0  . loratadine (CLARITIN) 10 MG tablet TAKE 1 TABLET BY MOUTH EVERY DAY (Patient not taking: Reported on 06/17/2019) 90 tablet 1  . rizatriptan (MAXALT) 10 MG tablet Take by mouth.    . topiramate (TOPAMAX) 100 MG tablet Take 100 mg by mouth 2 (two) times daily.     Social History   Socioeconomic History  . Marital status: Married    Spouse name: Not on file  . Number of children: Not on file  . Years of education: Not on file  . Highest education level: Not on file  Occupational History  .  Not on file  Tobacco Use  . Smoking status: Never Smoker  . Smokeless tobacco: Never Used  Substance and Sexual Activity  . Alcohol use: No  . Drug use: No  . Sexual activity: Yes    Birth control/protection: None  Other Topics Concern  . Not on file  Social History Narrative  . Not on file   Social Determinants of Health   Financial Resource Strain:   . Difficulty of Paying Living Expenses: Not on file  Food Insecurity:   . Worried About Programme researcher, broadcasting/film/video in the Last Year: Not on file  . Ran Out of Food in the Last Year: Not on file  Transportation Needs:   . Lack of Transportation (Medical): Not on file  . Lack of Transportation (Non-Medical): Not on file  Physical Activity:   . Days of Exercise per Week: Not on file  . Minutes of Exercise per Session: Not on file  Stress:   . Feeling of Stress : Not on file  Social Connections:   . Frequency of Communication with Friends and Family: Not on file  . Frequency of Social Gatherings with Friends and Family: Not on file  . Attends Religious Services: Not on file  . Active Member of Clubs or Organizations: Not on file  . Attends Banker Meetings: Not on file  . Marital Status: Not on  file  Intimate Partner Violence:   . Fear of Current or Ex-Partner: Not on file  . Emotionally Abused: Not on file  . Physically Abused: Not on file  . Sexually Abused: Not on file   No family history on file.  OBJECTIVE:  Vitals:   05/23/20 1626 05/23/20 1628  BP: (!) 169/125   Pulse: (!) 103   Resp: 19   Temp: 98.4 F (36.9 C)   TempSrc: Oral   SpO2: 97%   Weight:  260 lb (117.9 kg)  Height:  5\' 4"  (1.626 m)     General appearance: alert; appears fatigued, but nontoxic; speaking in full sentences and tolerating own secretions HEENT: NCAT; Ears: EACs clear, TMs pearly gray; Eyes: PERRL.  EOM grossly intact. Sinuses: nontender; Nose: nares patent without rhinorrhea, Throat: oropharynx clear, tonsils non erythematous  or enlarged, uvula midline  Neck: supple without LAD Lungs: unlabored respirations, symmetrical air entry; cough: mild; no respiratory distress; CTAB Heart: regular rate and rhythm.  Radial pulses 2+ symmetrical bilaterally Skin: warm and dry Psychological: alert and cooperative; normal mood and affect  LABS:  No results found for this or any previous visit (from the past 24 hour(s)).   ASSESSMENT & PLAN:  1. Viral URI   2. Encounter for screening for COVID-19     Meds ordered this encounter  Medications  . cetirizine (ZYRTEC ALLERGY) 10 MG tablet    Sig: Take 1 tablet (10 mg total) by mouth daily.    Dispense:  30 tablet    Refill:  0  . fluticasone (FLONASE) 50 MCG/ACT nasal spray    Sig: Place 1 spray into both nostrils daily for 14 days.    Dispense:  16 g    Refill:  0  . dexamethasone (DECADRON) 4 MG tablet    Sig: Take 1 tablet (4 mg total) by mouth daily for 7 days.    Dispense:  7 tablet    Refill:  0    Discharge instruction...    COVID testing ordered.  It will take between 2-7 days for test results.  Someone will contact you regarding abnormal results.    In the meantime: You should remain isolated in your home for 10 days from symptom onset AND greater than 24 hours after symptoms resolution (absence of fever without the use of fever-reducing medication and improvement in respiratory symptoms), whichever is longer Get plenty of rest and push fluids Decadron was prescribed zyrtec for nasal congestion, runny nose, and/or sore throat flonase for nasal congestion and runny nose Use medications daily for symptom relief Use OTC medications like ibuprofen or tylenol as needed fever or pain Call or go to the ED if you have any new or worsening symptoms such as fever, worsening cough, shortness of breath, chest tightness, chest pain, turning blue, changes in mental status, etc...   Reviewed expectations re: course of current medical issues. Questions  answered. Outlined signs and symptoms indicating need for more acute intervention. Patient verbalized understanding. After Visit Summary given.         , FNP 05/23/20 [id number]

## 2020-05-23 NOTE — Discharge Instructions (Signed)
COVID testing ordered.  It will take between 2-7 days for test results.  Someone will contact you regarding abnormal results.    In the meantime: You should remain isolated in your home for 10 days from symptom onset AND greater than 24 hours after symptoms resolution (absence of fever without the use of fever-reducing medication and improvement in respiratory symptoms), whichever is longer Get plenty of rest and push fluids Decadron was prescribed zyrtec for nasal congestion, runny nose, and/or sore throat flonase for nasal congestion and runny nose Use medications daily for symptom relief Use OTC medications like ibuprofen or tylenol as needed fever or pain Call or go to the ED if you have any new or worsening symptoms such as fever, worsening cough, shortness of breath, chest tightness, chest pain, turning blue, changes in mental status, etc..Marland Kitchen

## 2020-05-25 LAB — SARS-COV-2, NAA 2 DAY TAT

## 2020-05-25 LAB — NOVEL CORONAVIRUS, NAA: SARS-CoV-2, NAA: NOT DETECTED

## 2021-02-25 HISTORY — PX: LAPAROSCOPIC GASTRIC SLEEVE RESECTION: SHX5895

## 2021-10-25 ENCOUNTER — Other Ambulatory Visit: Payer: Self-pay

## 2021-10-25 ENCOUNTER — Encounter: Payer: Self-pay | Admitting: General Surgery

## 2021-10-25 ENCOUNTER — Ambulatory Visit (INDEPENDENT_AMBULATORY_CARE_PROVIDER_SITE_OTHER): Payer: No Typology Code available for payment source | Admitting: General Surgery

## 2021-10-25 VITALS — BP 147/99 | HR 77 | Temp 97.8°F | Resp 14 | Ht 64.0 in | Wt 202.0 lb

## 2021-10-25 DIAGNOSIS — K439 Ventral hernia without obstruction or gangrene: Secondary | ICD-10-CM | POA: Diagnosis not present

## 2021-10-25 MED ORDER — SULFAMETHOXAZOLE-TRIMETHOPRIM 800-160 MG PO TABS: 1.0000 | ORAL_TABLET | Freq: Two times a day (BID) | ORAL | 0 refills | Status: DC

## 2021-10-25 NOTE — Progress Notes (Signed)
Cynthia [name]; [id number]; [birth year]   HPI Patient is a 36 year old white female who presents back to schedule a laparoscopic ventral herniorrhaphy with mesh.  She states she has lost some weight recently.  She is having increasing umbilical irritation with clear yellow drainage.  She is trying to keep her umbilicus clean with soap and water.  She denies any fever or chills.  Her previously documented groin pain has resolved.  Her hernia has become more prominent due to her weight loss. Past Medical History:  Diagnosis Date   Medical history non-contributory    Migraines     Past Surgical History:  Procedure Laterality Date   CESAREAN SECTION      History reviewed. No pertinent family history.  Current Outpatient Medications on File Prior to Visit  Medication Sig Dispense Refill   rizatriptan (MAXALT) 10 MG tablet Take by mouth.     No current facility-administered medications on file prior to visit.    Allergies  Allergen Reactions   Anchovies [Fish Allergy]    Cefzil [Cefprozil]     Metal taste in mouth   Erythromycin    Topamax [Topiramate] Other (See Comments)    Difficulty focusing and speaking   Inderal [Propranolol] Other (See Comments)    Fatigue and jitteriness    Social History   Substance and Sexual Activity  Alcohol Use No    Social History   Tobacco Use  Smoking Status Never   Passive exposure: Never  Smokeless Tobacco Never    Review of Systems  Constitutional: Negative.   HENT: Negative.    Eyes:  Positive for blurred vision.  Respiratory: Negative.    Cardiovascular: Negative.   Gastrointestinal: Negative.   Genitourinary: Negative.   Musculoskeletal: Negative.   Skin: Negative.   Neurological: Negative.   Endo/Heme/Allergies: Negative.   Psychiatric/Behavioral: Negative.     Objective   Vitals:   10/25/21 1008  BP: (!) 147/99  Pulse: 77  Resp: 14  Temp: 97.8 F (36.6 C)  SpO2: 99%    Physical Exam Vitals reviewed.   Constitutional:      Appearance: Normal appearance. She is not ill-appearing.  HENT:     Head: Normocephalic and atraumatic.  Cardiovascular:     Rate and Rhythm: Normal rate and regular rhythm.     Heart sounds: Normal heart sounds. No murmur heard.   No friction rub. No gallop.  Pulmonary:     Effort: Pulmonary effort is normal. No respiratory distress.     Breath sounds: Normal breath sounds. No stridor. No wheezing, rhonchi or rales.  Abdominal:     General: Bowel sounds are normal. There is no distension.     Palpations: Abdomen is soft. There is no mass.     Tenderness: There is no abdominal tenderness. There is no guarding or rebound.     Hernia: A hernia is present.     Comments: Large greater than 5 cm hernia defect noted deep to the umbilicus.  The hernia is reducible.  Diastases recti present.  Her umbilical skin is erythematous but I could not find any specific ulceration.  Skin:    General: Skin is warm and dry.  Neurological:     Mental Status: She is alert and oriented to person, place, and time.   Previous notes reviewed Assessment  Ventral hernia, umbilical skin inflammation Plan  We will treat her umbilical skin inflammation with Bactrim DS 1 tablet p.o. twice daily x10 days.  I will see her back in 2  weeks for follow-up.  We will then most likely schedule laparoscopic ventral herniorrhaphy with mesh.  She understands and agrees to the treatment plan.

## 2021-11-02 ENCOUNTER — Telehealth: Payer: Self-pay | Admitting: *Deleted

## 2021-11-02 NOTE — Telephone Encounter (Signed)
Discussed with Dr. Lovell Sheehan. Agreeable to stopping ABTx at this time.  ? ?Call placed to patient. LMTRC.  ?

## 2021-11-02 NOTE — Telephone Encounter (Signed)
Patient returned call and made aware.

## 2021-11-02 NOTE — Telephone Encounter (Addendum)
Received call from patient.  ? ?Reports that she was seen on 10/25/2021 for umbilical cellulitis and was given Bactrim DS x10 days. She has completed x8 days of treatment at this time.  ? ?States that she has noted gum irritation, mouth pain and white film to lip this morning. States that she commonly has thrush/ yeast infections after ABTx. Reports that she does not usually respond well to Diflucan or Nystatin mouthwash. States that she has compounded mouthwash that she uses for thrush.  ? ?Reports that skin irritation has improved and redness has resolved. Reports there is still slight clear fluid drainage noted.  ? ?Requested to stop ABTx before Sx worsen. Will discuss with Dr. Lovell Sheehan and return call to patient.  ?

## 2021-11-08 ENCOUNTER — Other Ambulatory Visit: Payer: Self-pay

## 2021-11-08 ENCOUNTER — Ambulatory Visit (INDEPENDENT_AMBULATORY_CARE_PROVIDER_SITE_OTHER): Payer: No Typology Code available for payment source | Admitting: General Surgery

## 2021-11-08 ENCOUNTER — Encounter: Payer: Self-pay | Admitting: General Surgery

## 2021-11-08 VITALS — BP 169/98 | HR 64 | Temp 97.8°F | Resp 16 | Ht 64.0 in | Wt 201.0 lb

## 2021-11-08 DIAGNOSIS — K429 Umbilical hernia without obstruction or gangrene: Secondary | ICD-10-CM

## 2021-11-08 NOTE — Progress Notes (Signed)
Subjective:  ?  ? Cynthia [name]  ?Patient here for follow-up of cellulitis of her umbilicus.  She has finished her antibiotic course.  It is still little red with minimal drainage present.  The drainage is not purulent in nature. ?Objective:  ? ? BP (!) 169/98   Pulse 64   Temp 97.8 ?F (36.6 ?C) (Other (Comment))   Resp 16   Ht 5\' 4"  (1.626 m)   Wt 201 lb (91.2 kg)   SpO2 99%   BMI 34.50 kg/m?  ? ?General:  alert, cooperative, and no distress  ?Abdomen is soft.  Mild erythema is noted at the umbilicus within umbilical skin.  No purulent drainage present. ?   ? ?Assessment:  ? ? Umbilical hernia with mild skin irritation that appears to be more inflammatory than infectious.  ?  ?Plan:  ? ?Patient will call to schedule her laparoscopic ventral herniorrhaphy with mesh.  The risks and benefits of the procedure including bleeding, infection, mesh use, bowel injury, and the possibility of an open procedure were fully explained to the patient, who gave informed consent. ?

## 2021-12-21 NOTE — H&P (Signed)
Cynthia [name]; [id number]; Mar 14, 1986 ? ? ?HPI ?Patient is a 36 year old white female who presents back to schedule a laparoscopic ventral herniorrhaphy with mesh.  She states she has lost some weight recently.  She is having increasing umbilical irritation with clear yellow drainage.  She is trying to keep her umbilicus clean with soap and water.  She denies any fever or chills.  Her previously documented groin pain has resolved.  Her hernia has become more prominent due to her weight loss.  Patient states that she would rather have excision of her umbilicus in order to prevent further inflammation of the umbilicus.  She has been told that we will thus do a ventral herniorrhaphy with mesh using the open technique. ?Past Medical History:  ?Diagnosis Date  ? Medical history non-contributory   ? Migraines   ? ? ?Past Surgical History:  ?Procedure Laterality Date  ? CESAREAN SECTION    ? ? ?History reviewed. No pertinent family history. ? ?Current Outpatient Medications on File Prior to Visit  ?Medication Sig Dispense Refill  ? rizatriptan (MAXALT) 10 MG tablet Take by mouth.    ? ?No current facility-administered medications on file prior to visit.  ? ? ?Allergies  ?Allergen Reactions  ? Anchovies [Fish Allergy]   ? Cefzil [Cefprozil]   ?  Metal taste in mouth  ? Erythromycin   ? Topamax [Topiramate] Other (See Comments)  ?  Difficulty focusing and speaking  ? Inderal [Propranolol] Other (See Comments)  ?  Fatigue and jitteriness  ? ? ?Social History  ? ?Substance and Sexual Activity  ?Alcohol Use No  ? ? ?Social History  ? ?Tobacco Use  ?Smoking Status Never  ? Passive exposure: Never  ?Smokeless Tobacco Never  ? ? ?Review of Systems  ?Constitutional: Negative.   ?HENT: Negative.    ?Eyes:  Positive for blurred vision.  ?Respiratory: Negative.    ?Cardiovascular: Negative.   ?Gastrointestinal: Negative.   ?Genitourinary: Negative.   ?Musculoskeletal: Negative.   ?Skin: Negative.   ?Neurological: Negative.    ?Endo/Heme/Allergies: Negative.   ?Psychiatric/Behavioral: Negative.    ? ?Objective  ? ?Vitals:  ? 10/25/21 1008  ?BP: (!) 147/99  ?Pulse: 77  ?Resp: 14  ?Temp: 97.8 ?F (36.6 ?C)  ?SpO2: 99%  ? ? ?Physical Exam ?Vitals reviewed.  ?Constitutional:   ?   Appearance: Normal appearance. She is not ill-appearing.  ?HENT:  ?   Head: Normocephalic and atraumatic.  ?Cardiovascular:  ?   Rate and Rhythm: Normal rate and regular rhythm.  ?   Heart sounds: Normal heart sounds. No murmur heard. ?  No friction rub. No gallop.  ?Pulmonary:  ?   Effort: Pulmonary effort is normal. No respiratory distress.  ?   Breath sounds: Normal breath sounds. No stridor. No wheezing, rhonchi or rales.  ?Abdominal:  ?   General: Bowel sounds are normal. There is no distension.  ?   Palpations: Abdomen is soft. There is no mass.  ?   Tenderness: There is no abdominal tenderness. There is no guarding or rebound.  ?   Hernia: A hernia is present.  ?   Comments: Large greater than 5 cm hernia defect noted deep to the umbilicus.  The hernia is reducible.  Diastases recti present.  Her umbilical skin is erythematous but I could not find any specific ulceration.  ?Skin: ?   General: Skin is warm and dry.  ?Neurological:  ?   Mental Status: She is alert and oriented to person, place, and  time.  ? ?Previous notes reviewed ?Assessment  ?Ventral hernia, umbilical skin inflammation ?Plan  ?We will treat her umbilical skin inflammation with Bactrim DS 1 tablet p.o. twice daily x10 days.  Patient is scheduled for a ventral herniorrhaphy with mesh on 01/16/2022.  The risks and benefits of the procedure including bleeding, infection, mesh use, and the possibility of recurrence of the hernia were fully explained to the patient, who gave informed consent. ?

## 2022-01-10 NOTE — Patient Instructions (Signed)
? ? ? ? ? ? ? ? Cynthia [name] ? 01/10/2022  ?  ? @PREFPERIOPPHARMACY @ ? ? Your procedure is scheduled on  01/16/2022. ? ? [facility] at  0600  A.M. ? ? Call this number if you have problems the morning of surgery: ? [phone] ? ? Remember: ? Do not eat or drink after midnight. ?  ?  ? Take these medicines the morning of surgery with A SIP OF WATER  ? ? ?                                                  none. ?  ? Do not wear jewelry, make-up or nail polish. ? Do not wear lotions, powders, or perfumes, or deodorant. ? Do not shave 48 hours prior to surgery.  Men may shave face and neck. ? Do not bring valuables to the hospital. ? [facility] is not responsible for any belongings or valuables. ? ?Contacts, dentures or bridgework may not be worn into surgery.  Leave your suitcase in the car.  After surgery it may be brought to your room. ? ?For patients admitted to the hospital, discharge time will be determined by your treatment team. ? ?Patients discharged the day of surgery will not be allowed to drive home and must have someone with them for 24 hours.  ? ? ?Special instructions:   DO NOT smoke tobacco or vape for 24 hours before your procedure. ? ?Please read over the following fact sheets that you were given. ?Anesthesia Post-op Instructions and Care and Recovery After Surgery ?  ? ? ? Open Hernia Repair, Adult, Care After ?What can I expect after the procedure? ?After the procedure, it is common to have: ?Mild discomfort. ?Slight bruising. ?Mild swelling. ?Pain in the belly (abdomen). ?A small amount of blood from the cut from surgery (incision). ?Follow these instructions at home: ?Your doctor may give you more specific instructions. If you have problems, call your doctor. ?Medicines ?Take over-the-counter and prescription medicines only as told by your doctor. ?If told, take steps to prevent problems with pooping (constipation). You may need to: ?Drink enough fluid to keep your pee (urine) pale  yellow. ?Take medicines. You will be told what medicines to take. ?Eat foods that are high in fiber. These include beans, whole grains, and fresh fruits and vegetables. ?Limit foods that are high in fat and sugar. These include fried or sweet foods. ?Ask your doctor if you should avoid driving or using machines while you are taking your medicine. ?Incision care ? ?Follow instructions from your doctor about how to take care of your incision. Make sure you: ?Wash your hands with soap and water for at least 20 seconds before and after you change your bandage (dressing). If you cannot use soap and water, use hand sanitizer. ?Change your bandage. ?Leave stitches or skin glue in place for at least 2 weeks. ?Leave tape strips alone unless you are told to take them off. You may trim the edges of the tape strips if they curl up. ?Check your incision every day for signs of infection. Check for: ?More redness, swelling, or pain. ?More fluid or blood. ?Warmth. ?Pus or a bad smell. ?Wear loose, soft clothing while your incision heals. ?Activity ? ?Rest as told by your doctor. ?Do not lift anything that is heavier  than 10 lb (4.5 kg), or the limit that you are told. ?Do not play contact sports until your doctor says that this is safe. ?If you were given a sedative during your procedure, do not drive or use machines until your doctor says that it is safe. A sedative is a medicine that helps you relax. ?Return to your normal activities when your doctor says that it is safe. ?General instructions ?Do not take baths, swim, or use a hot tub. Ask your doctor about taking showers or sponge baths. ?Hold a pillow over your belly when you cough or sneeze. This helps with pain. ?Do not smoke or use any products that contain nicotine or tobacco. If you need help quitting, ask your doctor. ?Keep all follow-up visits. ?Contact a doctor if: ?You have any of these signs of infection in or around your incision: ?More redness, swelling, or  pain. ?More fluid or blood. ?Warmth. ?Pus. ?A bad smell. ?You have a fever or chills. ?You have blood in your poop (stool). ?You have not pooped (had a bowel movement) in 2-3 days. ?Medicine does not help your pain. ?Get help right away if: ?You have chest pain, or you are short of breath. ?You feel faint or light-headed. ?You have very bad pain. ?You vomit and your pain is worse. ?You have pain, swelling, or redness in a leg. ?These symptoms may be an emergency. Get help right away. Call your local emergency services (911 in the [place] ?Do not wait to see if the symptoms will go away. ?Do not drive yourself to the hospital. ?Summary ?After this procedure, it is common to have mild discomfort, slight bruising, and mild swelling. ?Follow instructions from your doctor about how to take care of your cut from surgery (incision). Check every day for signs of infection. ?Do not lift heavy objects or play contact sports until your doctor says it is safe. ?Return to your normal activities as told by your doctor. ?This information is not intended to replace advice given to you by your health care provider. Make sure you discuss any questions you have with your health care provider. ?Document Revised: 03/29/2020 Document Reviewed: 03/29/2020 ?Elsevier Patient Education ? 2023 [employer] ?General Anesthesia, Adult, Care After ?This sheet gives you information about how to care for yourself after your procedure. Your health care provider may also give you more specific instructions. If you have problems or questions, contact your health care provider. ?What can I expect after the procedure? ?After the procedure, the following side effects are common: ?Pain or discomfort at the IV site. ?Nausea. ?Vomiting. ?Sore throat. ?Trouble concentrating. ?Feeling cold or chills. ?Feeling weak or tired. ?Sleepiness and fatigue. ?Soreness and body aches. These side effects can affect parts of the body that were not involved in  surgery. ?Follow these instructions at home: ?For the time period you were told by your health care provider: ? ?Rest. ?Do not participate in activities where you could fall or become injured. ?Do not drive or use machinery. ?Do not drink alcohol. ?Do not take sleeping pills or medicines that cause drowsiness. ?Do not make important decisions or sign legal documents. ?Do not take care of children on your own. ?Eating and drinking ?Follow any instructions from your health care provider about eating or drinking restrictions. ?When you feel hungry, start by eating small amounts of foods that are soft and easy to digest (bland), such as toast. Gradually return to your regular diet. ?Drink enough fluid to keep your urine pale yellow. ?  If you vomit, rehydrate by drinking water, juice, or clear broth. ?General instructions ?If you have sleep apnea, surgery and certain medicines can increase your risk for breathing problems. Follow instructions from your health care provider about wearing your sleep device: ?Anytime you are sleeping, including during daytime naps. ?While taking prescription pain medicines, sleeping medicines, or medicines that make you drowsy. ?Have a responsible adult stay with you for the time you are told. It is important to have someone help care for you until you are awake and alert. ?Return to your normal activities as told by your health care provider. Ask your health care provider what activities are safe for you. ?Take over-the-counter and prescription medicines only as told by your health care provider. ?If you smoke, do not smoke without supervision. ?Keep all follow-up visits as told by your health care provider. This is important. ?Contact a health care provider if: ?You have nausea or vomiting that does not get better with medicine. ?You cannot eat or drink without vomiting. ?You have pain that does not get better with medicine. ?You are unable to pass urine. ?You develop a skin rash. ?You  have a fever. ?You have redness around your IV site that gets worse. ?Get help right away if: ?You have difficulty breathing. ?You have chest pain. ?You have blood in your urine or stool, or you vomit blood. ?Summary ?After

## 2022-01-12 ENCOUNTER — Encounter (HOSPITAL_COMMUNITY): Payer: Self-pay

## 2022-01-12 ENCOUNTER — Encounter (HOSPITAL_COMMUNITY)
Admission: RE | Admit: 2022-01-12 | Discharge: 2022-01-12 | Disposition: A | Discharge status: Home / Self Care | Payer: No Typology Code available for payment source | Source: Ambulatory Visit | Attending: General Surgery | Admitting: General Surgery

## 2022-01-12 ENCOUNTER — Other Ambulatory Visit (HOSPITAL_COMMUNITY): Payer: No Typology Code available for payment source

## 2022-01-12 VITALS — BP 162/96 | HR 61 | Temp 97.8°F | Resp 18 | Ht 64.0 in | Wt 186.0 lb

## 2022-01-12 DIAGNOSIS — Z01818 Encounter for other preprocedural examination: Secondary | ICD-10-CM | POA: Insufficient documentation

## 2022-01-12 LAB — POCT PREGNANCY, URINE: Preg Test, Ur: NEGATIVE

## 2022-01-16 ENCOUNTER — Ambulatory Visit (HOSPITAL_COMMUNITY): Payer: No Typology Code available for payment source | Admitting: Anesthesiology

## 2022-01-16 ENCOUNTER — Other Ambulatory Visit: Payer: Self-pay

## 2022-01-16 ENCOUNTER — Ambulatory Visit (HOSPITAL_COMMUNITY)
Admission: RE | Admit: 2022-01-16 | Discharge: 2022-01-16 | Disposition: A | Discharge status: Home / Self Care | Payer: No Typology Code available for payment source | Attending: General Surgery | Admitting: General Surgery

## 2022-01-16 ENCOUNTER — Encounter (HOSPITAL_COMMUNITY)
Admission: RE | Disposition: A | Discharge status: Home / Self Care | Payer: Self-pay | Source: Home / Self Care | Attending: General Surgery

## 2022-01-16 ENCOUNTER — Telehealth: Payer: Self-pay | Admitting: Family Medicine

## 2022-01-16 ENCOUNTER — Ambulatory Visit (HOSPITAL_BASED_OUTPATIENT_CLINIC_OR_DEPARTMENT_OTHER): Payer: No Typology Code available for payment source | Admitting: Anesthesiology

## 2022-01-16 ENCOUNTER — Encounter (HOSPITAL_COMMUNITY): Payer: Self-pay | Admitting: General Surgery

## 2022-01-16 DIAGNOSIS — K429 Umbilical hernia without obstruction or gangrene: Secondary | ICD-10-CM

## 2022-01-16 DIAGNOSIS — M62838 Other muscle spasm: Secondary | ICD-10-CM

## 2022-01-16 DIAGNOSIS — G43009 Migraine without aura, not intractable, without status migrainosus: Secondary | ICD-10-CM

## 2022-01-16 HISTORY — PX: UMBILICAL HERNIA REPAIR: SHX196

## 2022-01-16 SURGERY — REPAIR, HERNIA, UMBILICAL, ADULT
Anesthesia: General | Site: Abdomen

## 2022-01-16 MED ORDER — SODIUM CHLORIDE 0.9 % IR SOLN
Status: DC | PRN
  Administered 2022-01-16: 1000 mL

## 2022-01-16 MED ORDER — MIDAZOLAM HCL 2 MG/2ML IJ SOLN
INTRAMUSCULAR | Status: AC
  Filled 2022-01-16: qty 2

## 2022-01-16 MED ORDER — ROCURONIUM BROMIDE 10 MG/ML (PF) SYRINGE
PREFILLED_SYRINGE | INTRAVENOUS | Status: AC
  Filled 2022-01-16: qty 20

## 2022-01-16 MED ORDER — HYDROMORPHONE HCL 1 MG/ML IJ SOLN
0.2500 mg | INTRAMUSCULAR | Status: DC | PRN
  Administered 2022-01-16: 0.5 mg via INTRAVENOUS
  Filled 2022-01-16: qty 0.5

## 2022-01-16 MED ORDER — CHLORHEXIDINE GLUCONATE CLOTH 2 % EX PADS: 6.0000 | MEDICATED_PAD | Freq: Once | CUTANEOUS | Status: DC

## 2022-01-16 MED ORDER — VANCOMYCIN HCL IN DEXTROSE 1-5 GM/200ML-% IV SOLN
1000.0000 mg | INTRAVENOUS | Status: AC
  Administered 2022-01-16: 1000 mg via INTRAVENOUS
  Filled 2022-01-16: qty 200

## 2022-01-16 MED ORDER — DEXMEDETOMIDINE (PRECEDEX) IN NS 20 MCG/5ML (4 MCG/ML) IV SYRINGE
PREFILLED_SYRINGE | INTRAVENOUS | Status: DC | PRN
  Administered 2022-01-16: 20 ug via INTRAVENOUS

## 2022-01-16 MED ORDER — ONDANSETRON HCL 4 MG/2ML IJ SOLN
INTRAMUSCULAR | Status: DC | PRN
Start: 2022-01-16 — End: 2022-01-16
  Administered 2022-01-16: 88 mg via INTRAVENOUS

## 2022-01-16 MED ORDER — LACTATED RINGERS IV SOLN
INTRAVENOUS | Status: DC
  Administered 2022-01-16: 1000 mL via INTRAVENOUS

## 2022-01-16 MED ORDER — MEPERIDINE HCL 50 MG/ML IJ SOLN: 6.2500 mg | INTRAMUSCULAR | Status: DC | PRN

## 2022-01-16 MED ORDER — HYDROCODONE-ACETAMINOPHEN 5-325 MG PO TABS: 1.0000 | ORAL_TABLET | ORAL | 0 refills | Status: DC | PRN

## 2022-01-16 MED ORDER — ROCURONIUM BROMIDE 100 MG/10ML IV SOLN
INTRAVENOUS | Status: DC | PRN
  Administered 2022-01-16: 40 mg via INTRAVENOUS

## 2022-01-16 MED ORDER — SUGAMMADEX SODIUM 200 MG/2ML IV SOLN
INTRAVENOUS | Status: DC | PRN
Start: 2022-01-16 — End: 2022-01-16
  Administered 2022-01-16: 337.6 mg via INTRAVENOUS

## 2022-01-16 MED ORDER — CHLORHEXIDINE GLUCONATE 0.12 % MT SOLN
15.0000 mL | Freq: Once | OROMUCOSAL | Status: AC
  Administered 2022-01-16: 15 mL via OROMUCOSAL

## 2022-01-16 MED ORDER — DEXAMETHASONE SODIUM PHOSPHATE 4 MG/ML IJ SOLN
INTRAMUSCULAR | Status: DC | PRN
  Administered 2022-01-16: 10 mg via INTRAVENOUS

## 2022-01-16 MED ORDER — BUPIVACAINE LIPOSOME 1.3 % IJ SUSP
INTRAMUSCULAR | Status: DC | PRN
  Administered 2022-01-16: 20 mL

## 2022-01-16 MED ORDER — FENTANYL CITRATE (PF) 100 MCG/2ML IJ SOLN
INTRAMUSCULAR | Status: AC
  Filled 2022-01-16: qty 2

## 2022-01-16 MED ORDER — ONDANSETRON HCL 4 MG/2ML IJ SOLN
INTRAMUSCULAR | Status: AC
  Filled 2022-01-16: qty 6

## 2022-01-16 MED ORDER — MIDAZOLAM HCL 5 MG/5ML IJ SOLN
INTRAMUSCULAR | Status: DC | PRN
Start: 2022-01-16 — End: 2022-01-16
  Administered 2022-01-16: 2 mg via INTRAVENOUS

## 2022-01-16 MED ORDER — DIPHENHYDRAMINE HCL 50 MG/ML IJ SOLN
INTRAMUSCULAR | Status: DC | PRN
  Administered 2022-01-16: 12.5 mg via INTRAVENOUS

## 2022-01-16 MED ORDER — PROPOFOL 10 MG/ML IV BOLUS
INTRAVENOUS | Status: DC | PRN
  Administered 2022-01-16: 150 mg via INTRAVENOUS

## 2022-01-16 MED ORDER — FENTANYL CITRATE (PF) 100 MCG/2ML IJ SOLN
INTRAMUSCULAR | Status: DC | PRN
  Administered 2022-01-16 (×4): 50 ug via INTRAVENOUS

## 2022-01-16 MED ORDER — LIDOCAINE HCL (PF) 2 % IJ SOLN
INTRAMUSCULAR | Status: AC
  Filled 2022-01-16: qty 10

## 2022-01-16 MED ORDER — PROPOFOL 10 MG/ML IV BOLUS
INTRAVENOUS | Status: AC
  Filled 2022-01-16: qty 20

## 2022-01-16 MED ORDER — DEXAMETHASONE SODIUM PHOSPHATE 10 MG/ML IJ SOLN
INTRAMUSCULAR | Status: AC
Start: 2022-01-16 — End: ?
  Filled 2022-01-16: qty 2

## 2022-01-16 MED ORDER — LIDOCAINE HCL (CARDIAC) PF 100 MG/5ML IV SOSY
PREFILLED_SYRINGE | INTRAVENOUS | Status: DC | PRN
  Administered 2022-01-16: 60 mg via INTRATRACHEAL

## 2022-01-16 MED ORDER — ORAL CARE MOUTH RINSE: 15.0000 mL | Freq: Once | OROMUCOSAL | Status: AC

## 2022-01-16 MED ORDER — ONDANSETRON HCL 4 MG/2ML IJ SOLN: 4.0000 mg | Freq: Once | INTRAMUSCULAR | Status: DC | PRN

## 2022-01-16 MED ORDER — PROPOFOL 500 MG/50ML IV EMUL
INTRAVENOUS | Status: DC | PRN
  Administered 2022-01-16: 50 ug/kg/min via INTRAVENOUS

## 2022-01-16 MED ORDER — KETOROLAC TROMETHAMINE 30 MG/ML IJ SOLN
30.0000 mg | Freq: Once | INTRAMUSCULAR | Status: AC
  Administered 2022-01-16: 30 mg via INTRAVENOUS
  Filled 2022-01-16: qty 1

## 2022-01-16 SURGICAL SUPPLY — 30 items
ADH SKN CLS APL DERMABOND .7 (GAUZE/BANDAGES/DRESSINGS) ×1
APL PRP STRL LF DISP 70% ISPRP (MISCELLANEOUS) ×1
BLADE SURG SZ11 CARB STEEL (BLADE) ×2 IMPLANT
CHLORAPREP W/TINT 26 (MISCELLANEOUS) ×2 IMPLANT
CLOTH BEACON ORANGE TIMEOUT ST (SAFETY) ×2 IMPLANT
COVER LIGHT HANDLE STERIS (MISCELLANEOUS) ×4 IMPLANT
DERMABOND ADVANCED (GAUZE/BANDAGES/DRESSINGS) ×1
DERMABOND ADVANCED .7 DNX12 (GAUZE/BANDAGES/DRESSINGS) ×1 IMPLANT
ELECT REM PT RETURN 9FT ADLT (ELECTROSURGICAL) ×2
ELECTRODE REM PT RTRN 9FT ADLT (ELECTROSURGICAL) ×1 IMPLANT
GLOVE BIOGEL PI IND STRL 7.0 (GLOVE) ×2 IMPLANT
GLOVE BIOGEL PI INDICATOR 7.0 (GLOVE) ×2
GLOVE SURG SS PI 7.5 STRL IVOR (GLOVE) ×2 IMPLANT
GOWN STRL REUS W/TWL LRG LVL3 (GOWN DISPOSABLE) ×4 IMPLANT
INST SET MINOR GENERAL (KITS) ×2 IMPLANT
KIT TURNOVER KIT A (KITS) ×2 IMPLANT
MANIFOLD NEPTUNE II (INSTRUMENTS) ×2 IMPLANT
MESH VENTRALEX ST 8CM LRG (Mesh General) ×1 IMPLANT
NDL HYPO 21X1.5 SAFETY (NEEDLE) ×1 IMPLANT
NEEDLE HYPO 21X1.5 SAFETY (NEEDLE) ×2 IMPLANT
NS IRRIG 1000ML POUR BTL (IV SOLUTION) ×2 IMPLANT
PACK MINOR (CUSTOM PROCEDURE TRAY) ×2 IMPLANT
PAD ARMBOARD 7.5X6 YLW CONV (MISCELLANEOUS) ×2 IMPLANT
SET BASIN LINEN APH (SET/KITS/TRAYS/PACK) ×2 IMPLANT
SUT ETHIBOND NAB MO 7 #0 18IN (SUTURE) ×2 IMPLANT
SUT MNCRL AB 4-0 PS2 18 (SUTURE) ×2 IMPLANT
SUT VIC AB 2-0 CT2 27 (SUTURE) ×2 IMPLANT
SUT VIC AB 3-0 SH 27 (SUTURE) ×2
SUT VIC AB 3-0 SH 27X BRD (SUTURE) ×1 IMPLANT
SYR 20ML LL LF (SYRINGE) ×3 IMPLANT

## 2022-01-16 NOTE — Interval H&P Note (Signed)
History and Physical Interval Note:  01/16/2022 7:25 AM  Cynthia [name]  has presented today for surgery, with the diagnosis of UMBILICAL HERNIA >5CM.  The various methods of treatment have been discussed with the patient and family. After consideration of risks, benefits and other options for treatment, the patient has consented to  Procedure(s): HERNIA REPAIR UMBILICAL ADULT; OPEN WITH MESH (N/A) as a surgical intervention.  The patient's history has been reviewed, patient examined, no change in status, stable for surgery.  I have reviewed the patient's chart and labs.  Questions were answered to the patient's satisfaction.     [name]

## 2022-01-16 NOTE — Anesthesia Preprocedure Evaluation (Signed)
Anesthesia Evaluation  Patient identified by MRN, date of birth, ID band Patient awake    Reviewed: Allergy & Precautions, NPO status , Patient's Chart, lab work & pertinent test results  Airway Mallampati: II  TM Distance: >3 FB Neck ROM: Full    Dental  (+) Dental Advisory Given, Teeth Intact   Pulmonary neg pulmonary ROS,    Pulmonary exam normal breath sounds clear to auscultation       Cardiovascular negative cardio ROS Normal cardiovascular exam Rhythm:Regular Rate:Normal     Neuro/Psych  Headaches,  Neuromuscular disease negative psych ROS   GI/Hepatic negative GI ROS, Neg liver ROS,   Endo/Other  negative endocrine ROS  Renal/GU negative Renal ROS  negative genitourinary   Musculoskeletal negative musculoskeletal ROS (+)   Abdominal   Peds negative pediatric ROS (+)  Hematology negative hematology ROS (+)   Anesthesia Other Findings   Reproductive/Obstetrics negative OB ROS                            Anesthesia Physical Anesthesia Plan  ASA: 2  Anesthesia Plan: General   Post-op Pain Management: Dilaudid IV   Induction: Intravenous  PONV Risk Score and Plan: 4 or greater and Ondansetron, Dexamethasone and Midazolam  Airway Management Planned: Oral ETT  Additional Equipment:   Intra-op Plan:   Post-operative Plan: Extubation in OR  Informed Consent: I have reviewed the patients History and Physical, chart, labs and discussed the procedure including the risks, benefits and alternatives for the proposed anesthesia with the patient or authorized representative who has indicated his/her understanding and acceptance.     Dental advisory given  Plan Discussed with: CRNA and Surgeon  Anesthesia Plan Comments:         Anesthesia Quick Evaluation

## 2022-01-16 NOTE — Anesthesia Procedure Notes (Signed)
Procedure Name: Intubation Date/Time: 01/16/2022 7:40 AM Performed by: Minerva Ends, CRNA Pre-anesthesia Checklist: Patient identified, Emergency Drugs available, Suction available and Patient being monitored Patient Re-evaluated:Patient Re-evaluated prior to induction Oxygen Delivery Method: Circle system utilized Preoxygenation: Pre-oxygenation with 100% oxygen Induction Type: IV induction Ventilation: Mask ventilation without difficulty Laryngoscope Size: Mac and 3 Grade View: Grade I Tube type: Oral Tube size: 7.0 mm Number of attempts: 1 Airway Equipment and Method: Stylet and Oral airway Placement Confirmation: ETT inserted through vocal cords under direct vision, positive ETCO2 and breath sounds checked- equal and bilateral Secured at: 21 cm Tube secured with: Tape Dental Injury: Teeth and Oropharynx as per pre-operative assessment

## 2022-01-16 NOTE — Anesthesia Postprocedure Evaluation (Signed)
Anesthesia Post Note  Patient: Cynthia [name]  Procedure(s) Performed: HERNIA REPAIR UMBILICAL ADULT; OPEN WITH MESH (Abdomen)  Patient location during evaluation: Phase II Anesthesia Type: General Level of consciousness: awake and alert and oriented Pain management: pain level controlled Vital Signs Assessment: post-procedure vital signs reviewed and stable Respiratory status: spontaneous breathing, nonlabored ventilation and respiratory function stable Cardiovascular status: blood pressure returned to baseline and stable Postop Assessment: no apparent nausea or vomiting Anesthetic complications: no   No notable events documented.   Last Vitals:  Vitals:   01/16/22 0900 01/16/22 0924  BP: (!) 133/91 138/73  Pulse: (!) 55 (!) 53  Resp: 15 12  Temp:    SpO2: 100% 100%    Last Pain:  Vitals:   01/16/22 0924  TempSrc:   PainSc: 4                  Kaiden Dardis C Meliza Kage

## 2022-01-16 NOTE — Op Note (Signed)
Patient:  Cynthia [name]  DOB:  1986-08-12  MRN:  [id number]   Preop Diagnosis: Umbilical hernia  Postop Diagnosis: Same  Procedure: Umbilical herniorrhaphy with mesh  Surgeon: Aviva Signs, MD  Anes: General endotracheal  Indications: Patient is a 36 year old white female who has a large reducible umbilical hernia.  She has also had recurrent drainage from the umbilicus.  The risks and benefits of the procedure including bleeding, infection, mesh use, the possibility of recurrence of the hernia as well as the need to remove the umbilical skin were fully explained to the patient, who gave informed consent.  Procedure note: The patient was placed in the supine position.  After induction of general endotracheal anesthesia, the abdomen was prepped and draped using the usual sterile technique with ChloraPrep.  Surgical site confirmation was performed.  An elliptical incision was made around the umbilicus.  The dissection was taken down to the fascia.  The umbilicus with the hernia sac was excised at the fascial level without difficulty.  The resultant defect was approximately 5 cm in its greatest diameter.  An 8 cm Bard Ventralax ST patch was then inserted and secured to the fascia using 0 Ethibond interrupted sutures.  The overlying fascia was reapproximated using 0 Ethibond interrupted sutures transversely.  The subcutaneous layer was reapproximated using 3-0 Vicryl interrupted sutures.  Exparel was instilled into the surrounding wound.  The skin was closed using a 4-0 Monocryl subcuticular suture.  Dermabond was applied.  All tape and needle counts were correct at the end of the procedure.  The patient was extubated in the operating room and transferred to PACU in stable condition.  Complications: None  EBL: Minimal  Specimen: None

## 2022-01-16 NOTE — Telephone Encounter (Signed)
STD Paperwork filled out and faxed to [facility] [employer] and [facility] at [phone]. Confirmation received.  Out of work starting 01/16/2022 with a return to work date of 01/28/2022.

## 2022-01-16 NOTE — Transfer of Care (Signed)
Immediate Anesthesia Transfer of Care Note  Patient: Cynthia [name]  Procedure(s) Performed: HERNIA REPAIR UMBILICAL ADULT; OPEN WITH MESH (Abdomen)  Patient Location: PACU  Anesthesia Type:General  Level of Consciousness: awake, alert  and oriented  Airway & Oxygen Therapy: Patient Spontanous Breathing  Post-op Assessment: Report given to RN and Post -op Vital signs reviewed and stable  Post vital signs: Reviewed and stable  Last Vitals:  Vitals Value Taken Time  BP    Temp    Pulse 67 01/16/22 0830  Resp 19 01/16/22 0830  SpO2 98 % 01/16/22 0830  Vitals shown include unvalidated device data.  Last Pain:  Vitals:   01/16/22 0646  TempSrc: Oral  PainSc: 0-No pain      Patients Stated Pain Goal: 6 (01/16/22 3474)  Complications: No notable events documented.

## 2022-01-17 ENCOUNTER — Encounter (HOSPITAL_COMMUNITY): Payer: Self-pay | Admitting: General Surgery

## 2022-01-26 ENCOUNTER — Encounter: Payer: Self-pay | Admitting: General Surgery

## 2022-01-26 ENCOUNTER — Ambulatory Visit (INDEPENDENT_AMBULATORY_CARE_PROVIDER_SITE_OTHER): Payer: No Typology Code available for payment source | Admitting: General Surgery

## 2022-01-26 VITALS — BP 143/96 | HR 56 | Temp 98.3°F | Resp 16 | Ht 64.0 in | Wt 186.0 lb

## 2022-01-26 DIAGNOSIS — Z09 Encounter for follow-up examination after completed treatment for conditions other than malignant neoplasm: Secondary | ICD-10-CM

## 2022-01-26 NOTE — Progress Notes (Signed)
Subjective:     Cynthia [name]  Patient here for postop visit, s/p umbilical herniorrhaphy with mesh.  She has been doing well.  She has no complaints. Objective:    BP (!) 143/96   Pulse (!) 56   Temp 98.3 F (36.8 C) (Oral)   Resp 16   Ht 5\' 4"  (1.626 m)   Wt 186 lb (84.4 kg)   LMP 01/15/2022 (Exact Date)   SpO2 99%   BMI 31.93 kg/m   General:  alert, cooperative, and no distress  Abdomen soft, incision healing well.  No seroma present.  No recurrent hernia present.     Assessment:    Doing well postoperatively.    Plan:   May increase activity as able.  Follow-up here as needed.

## 2022-02-02 ENCOUNTER — Ambulatory Visit (INDEPENDENT_AMBULATORY_CARE_PROVIDER_SITE_OTHER): Payer: No Typology Code available for payment source | Admitting: General Surgery

## 2022-02-02 ENCOUNTER — Encounter: Payer: Self-pay | Admitting: General Surgery

## 2022-02-02 VITALS — BP 157/96 | HR 60 | Temp 97.8°F | Resp 14 | Ht 64.0 in | Wt 185.0 lb

## 2022-02-02 DIAGNOSIS — L918 Other hypertrophic disorders of the skin: Secondary | ICD-10-CM

## 2022-02-02 NOTE — Progress Notes (Signed)
Patient presents for excision of a left perineal irritated skin tag removal.  It has been irritating her for some time, and now she presents for removal in the office.  The risks and benefits of the procedure were fully explained to the patient, who gave informed consent.  Procedure note: Patient was placed in the left lateral decubitus position.  The skin tag in the left perineal region was prepped and draped using usual sterile technique with Betadine.  Surgical site confirmation was performed.  1% Xylocaine was used for local anesthesia.  Using eye cautery, the skin tag was excised.  The pedunculated skin tag was only 2 to 3 mm in its greatest diameter at the base.  There was only partial skin depth.  Patient tolerated the procedure well.  A bandage was applied.  The skin tag was disposed of.

## 2022-12-22 ENCOUNTER — Other Ambulatory Visit: Payer: Self-pay

## 2022-12-22 ENCOUNTER — Encounter (HOSPITAL_BASED_OUTPATIENT_CLINIC_OR_DEPARTMENT_OTHER): Payer: Self-pay | Admitting: Obstetrics and Gynecology

## 2022-12-22 NOTE — Progress Notes (Signed)
Your procedure is scheduled on Monday, 01/08/2023.  Report to [facility] AT  11:30  AM.   Call this number if you have problems the morning of surgery  [phone].   OUR ADDRESS IS [address].  WE ARE LOCATED IN [facility].  PLEASE BRING YOUR INSURANCE CARD AND PHOTO ID DAY OF SURGERY.  ONLY 2 PEOPLE ARE ALLOWED IN  WAITING  ROOM                                      REMEMBER:  DO NOT EAT FOOD, CANDY GUM OR MINTS  AFTER MIDNIGHT THE NIGHT BEFORE YOUR SURGERY . YOU MAY HAVE CLEAR LIQUIDS FROM MIDNIGHT THE NIGHT BEFORE YOUR SURGERY UNTIL  10:30 AM. NO CLEAR LIQUIDS AFTER   10:30 AM DAY OF SURGERY.  YOU MAY  BRUSH YOUR TEETH MORNING OF SURGERY AND RINSE YOUR MOUTH OUT, NO CHEWING GUM CANDY OR MINTS.     CLEAR LIQUID DIET    Allowed      Water                                                                   Coffee and tea, regular and decaf  (NO cream or milk products of any type, may sweeten)                         Carbonated beverages, regular and diet                                    Sports drinks like Gatorade _____________________________________________________________________     TAKE ONLY THESE MEDICATIONS MORNING OF SURGERY: Valtrex                                        DO NOT WEAR JEWERLY/  METAL/  PIERCINGS (INCLUDING NO PLASTIC PIERCINGS) DO NOT WEAR LOTIONS, POWDERS, PERFUMES OR NAIL POLISH ON YOUR FINGERNAILS. TOENAIL POLISH IS OK TO WEAR. DO NOT SHAVE FOR 48 HOURS PRIOR TO DAY OF SURGERY.  CONTACTS, GLASSES, OR DENTURES MAY NOT BE WORN TO SURGERY.  REMEMBER: NO SMOKING, VAPING ,  DRUGS OR ALCOHOL FOR 24 HOURS BEFORE YOUR SURGERY.                                    [facility] IS NOT RESPONSIBLE  FOR ANY BELONGINGS.                                                                    Marland Kitchen           [facility] - Preparing for Surgery Before  surgery, you can play an important role.  Because skin is not  sterile, your skin needs to be as free of germs as possible.  You can reduce the number of germs on your skin by washing with CHG (chlorahexidine gluconate) soap before surgery.  CHG is an antiseptic cleaner which kills germs and bonds with the skin to continue killing germs even after washing. Please DO NOT use if you have an allergy to CHG or antibacterial soaps.  If your skin becomes reddened/irritated stop using the CHG and inform your nurse when you arrive at Short Stay. Do not shave (including legs and underarms) for at least 48 hours prior to the first CHG shower.  You may shave your face/neck. Please follow these instructions carefully:  1.  Shower with CHG Soap the night before surgery and the  morning of Surgery.  2.  If you choose to wash your hair, wash your hair first as usual with your  normal  shampoo.  3.  After you shampoo, rinse your hair and body thoroughly to remove the  shampoo.                                        4.  Use CHG as you would any other liquid soap.  You can apply chg directly  to the skin and wash , chg soap provided, night before and morning of your surgery.  5.  Apply the CHG Soap to your body ONLY FROM THE NECK DOWN.   Do not use on face/ open                           Wound or open sores. Avoid contact with eyes, ears mouth and genitals (private parts).                       Wash face,  Genitals (private parts) with your normal soap.             6.  Wash thoroughly, paying special attention to the area where your surgery  will be performed.  7.  Thoroughly rinse your body with warm water from the neck down.  8.  DO NOT shower/wash with your normal soap after using and rinsing off  the CHG Soap.             9.  Pat yourself dry with a clean towel.            10.  Wear clean pajamas.            11.  Place clean sheets on your bed the night of your first shower and do not  sleep with pets. Day of Surgery : Do not apply any lotions/ powders the morning of  surgery.  Please wear clean clothes to the hospital/surgery center.  IF YOU HAVE ANY SKIN IRRITATION OR PROBLEMS WITH THE SURGICAL SOAP, PLEASE GET A BAR OF GOLD DIAL SOAP AND SHOWER THE NIGHT BEFORE YOUR SURGERY AND THE MORNING OF YOUR SURGERY. PLEASE LET THE NURSE KNOW MORNING OF YOUR SURGERY IF YOU HAD ANY PROBLEMS WITH THE SURGICAL SOAP.   YOUR SURGEON MAY HAVE REQUESTED EXTENDED RECOVERY TIME AFTER YOUR SURGERY. IT COULD BE A  JUST A FEW HOURS  UP TO AN OVERNIGHT STAY.  YOUR SURGEON SHOULD HAVE DISCUSSED THIS WITH YOU PRIOR TO YOUR  SURGERY. IN THE EVENT YOU NEED TO STAY OVERNIGHT PLEASE REFER TO THE FOLLOWING GUIDELINES. YOU MAY HAVE UP TO 4 VISITORS  MAY VISIT IN THE EXTENDED RECOVERY ROOM UNTIL 800 PM ONLY.  ONE  VISITOR AGE 10 AND OVER MAY SPEND THE NIGHT AND MUST BE IN EXTENDED RECOVERY ROOM NO LATER THAN 800 PM . YOUR DISCHARGE TIME AFTER YOU SPEND THE NIGHT IS 900 AM THE MORNING AFTER YOUR SURGERY. YOU MAY PACK A SMALL OVERNIGHT BAG WITH TOILETRIES FOR YOUR OVERNIGHT STAY IF YOU WISH.  REGARDLESS OF IF YOU STAY OVER NIGHT OR ARE DISCHARGED THE SAME DAY YOU WILL BE REQUIRED TO HAVE A RESPONSIBLE ADULT (18 YRS OLD OR OLDER) STAY WITH YOU FOR AT LEAST THE FIRST 24 HOURS  YOUR PRESCRIPTION MEDICATIONS WILL BE PROVIDED DURING YOUR HOSPITAL STAY.  ________________________________________________________________________                                                        QUESTIONS Mechele Claude PRE OP NURSE PHONE [phone].

## 2022-12-22 NOTE — Progress Notes (Signed)
Spoke w/ via phone for pre-op interview---Cynthia [name] needs dos---- urine pregnancy per anesthesia, surgeon orders pending as of 12/22/22              [name] results------01/03/23 [name] appt for cbc, type & screen, 01/12/22 EKG in chart & Epic COVID test -----patient states asymptomatic no test needed Arrive at -------1130 on Monday, 01/08/2023 NPO after MN NO Solid Food.  Clear liquids from MN until---1030 Med rec completed Medications to take morning of surgery -----Valtrex Diabetic medication -----n/a Patient instructed no nail polish to be worn day of surgery Patient instructed to bring photo id and insurance card day of surgery Patient aware to have Driver (ride ) / caregiver    for 24 hours after surgery - husband, Cynthia [name] Patient Special Instructions -----Extended / overnight stay instructions given. Pre-Op special Instructions -----Requested orders from Dr. Conni Elliot via Epic IB on 12/22/22. Patient verbalized understanding of instructions that were given at this phone interview. Patient denies shortness of breath, chest pain, fever, cough at this phone interview.

## 2022-12-26 ENCOUNTER — Other Ambulatory Visit: Payer: Self-pay | Admitting: Obstetrics and Gynecology

## 2023-01-03 ENCOUNTER — Inpatient Hospital Stay: Payer: No Typology Code available for payment source

## 2023-01-03 ENCOUNTER — Encounter (HOSPITAL_COMMUNITY)
Admission: RE | Admit: 2023-01-03 | Discharge: 2023-01-03 | Disposition: A | Discharge status: Home / Self Care | Payer: No Typology Code available for payment source | Source: Ambulatory Visit | Attending: Obstetrics and Gynecology | Admitting: Obstetrics and Gynecology

## 2023-01-03 ENCOUNTER — Inpatient Hospital Stay: Payer: No Typology Code available for payment source | Attending: Genetic Counselor | Admitting: Genetic Counselor

## 2023-01-03 DIAGNOSIS — Z01812 Encounter for preprocedural laboratory examination: Secondary | ICD-10-CM | POA: Diagnosis not present

## 2023-01-03 DIAGNOSIS — Z8041 Family history of malignant neoplasm of ovary: Secondary | ICD-10-CM | POA: Diagnosis not present

## 2023-01-03 DIAGNOSIS — Z803 Family history of malignant neoplasm of breast: Secondary | ICD-10-CM

## 2023-01-03 LAB — CBC
HCT: 30.9 % — ABNORMAL LOW (ref 36.0–46.0)
Hemoglobin: 8.7 g/dL — ABNORMAL LOW (ref 12.0–15.0)
MCH: 20 pg — ABNORMAL LOW (ref 26.0–34.0)
MCHC: 28.2 g/dL — ABNORMAL LOW (ref 30.0–36.0)
MCV: 71 fL — ABNORMAL LOW (ref 80.0–100.0)
Platelets: 241 10*3/uL (ref 150–400)
RBC: 4.35 MIL/uL (ref 3.87–5.11)
RDW: 17.9 % — ABNORMAL HIGH (ref 11.5–15.5)
WBC: 6.9 10*3/uL (ref 4.0–10.5)
nRBC: 0 % (ref 0.0–0.2)

## 2023-01-03 LAB — BASIC METABOLIC PANEL
Anion gap: 6 (ref 5–15)
BUN: 18 mg/dL (ref 6–20)
CO2: 25 mmol/L (ref 22–32)
Calcium: 8.9 mg/dL (ref 8.9–10.3)
Chloride: 107 mmol/L (ref 98–111)
Creatinine, Ser: 0.73 mg/dL (ref 0.44–1.00)
GFR, Estimated: >60 mL/min (ref >60)
Glucose, Bld: 89 mg/dL (ref 70–99)
Potassium: 3.7 mmol/L (ref 3.5–5.1)
Sodium: 138 mmol/L (ref 135–145)

## 2023-01-03 NOTE — Progress Notes (Signed)
I routed 01/03/23 CBC to Dr. Carrolyn Meiers. Hgb was 8.6. Patient is scheduled for a hysterectomy at [facility] on 01/08/23. I also spoke with Dr. Conni Elliot on the phone and let her know I had routed the results to her and the Hgb was 8.6.

## 2023-01-04 ENCOUNTER — Encounter: Payer: Self-pay | Admitting: Genetic Counselor

## 2023-01-04 ENCOUNTER — Other Ambulatory Visit: Payer: Self-pay | Admitting: Obstetrics and Gynecology

## 2023-01-04 DIAGNOSIS — Z803 Family history of malignant neoplasm of breast: Secondary | ICD-10-CM | POA: Insufficient documentation

## 2023-01-04 DIAGNOSIS — Z8041 Family history of malignant neoplasm of ovary: Secondary | ICD-10-CM | POA: Insufficient documentation

## 2023-01-04 DIAGNOSIS — D649 Anemia, unspecified: Secondary | ICD-10-CM

## 2023-01-04 NOTE — Progress Notes (Signed)
REFERRING PROVIDER: Lawerance Sabal, [place] [address] [place],  [place] [address]  PRIMARY PROVIDER:  Lawerance Sabal, [place]  PRIMARY REASON FOR VISIT:  1. Family history of ovarian cancer   2. Family history of breast cancer in female      HISTORY OF PRESENT ILLNESS:   Cynthia [name], a 37 y.o. female, was seen for a Cynthia [name] at the request of Dr. Conni Elliot due to a family history of cancer and a genetic test that identified a BRCA1 VUS.  Cynthia [name] presents to clinic today to discuss the possibility of a hereditary predisposition to cancer, genetic testing, and to further clarify her future cancer risks, as well as potential cancer risks for family members.   Cynthia [name] is a 37 y.o. female with no personal history of cancer.    CANCER HISTORY:  Oncology History   No history exists.     RISK FACTORS:  Menarche was at age 68.  First live birth at age 50.  OCP use for approximately 5 years.  Ovaries intact: yes.  Hysterectomy: no.  Menopausal status: premenopausal.  HRT use: 0 years. Colonoscopy: yes; normal. Mammogram within the last year: no. Number of breast biopsies: 0. Up to date with pelvic exams: yes. Any excessive radiation exposure in the past: no  Past Medical History:  Diagnosis Date   Family history of breast cancer in female    Family history of ovarian cancer    Gestational HTN    History of hiatal hernia    repaired in 2022   Migraines    Follows w/ Lawerance Sabal, [place].    Past Surgical History:  Procedure Laterality Date   CESAREAN SECTION     x 2   LAPAROSCOPIC GASTRIC SLEEVE RESECTION  02/2021   with hiatal hernia repair   TUBAL LIGATION  07/10/2017   UMBILICAL HERNIA REPAIR N/A 01/16/2022   Procedure: HERNIA REPAIR UMBILICAL ADULT; OPEN WITH MESH;  Surgeon: [name], MD;  Location: [facility];  Service: General;  Laterality: N/A;    Social History   Socioeconomic History   Marital status: Married    Spouse name: Not on file    Number of children: Not on file   Years of education: Not on file   Highest education level: Not on file  Occupational History   Not on file  Tobacco Use   Smoking status: Never    Passive exposure: Never   Smokeless tobacco: Never  Vaping Use   Vaping Use: Never used  Substance and Sexual Activity   Alcohol use: No   Drug use: No   Sexual activity: Yes    Birth control/protection: None  Other Topics Concern   Not on file  Social History Narrative   Not on file   Social Determinants of Health   Financial Resource Strain: Not on file  Food Insecurity: Not on file  Transportation Needs: Not on file  Physical Activity: Not on file  Stress: Not on file  Social Connections: Not on file     FAMILY HISTORY:  We obtained a detailed, 4-generation family history.  Significant diagnoses are listed below: Family History  Problem Relation Age of Onset   Ovarian cancer Mother 44   Breast cancer Father 40   Kidney cancer Father 23   Colon polyps Father    Throat cancer Maternal Uncle    Schizophrenia Maternal Grandmother        d. 29s   Heart disease Maternal Grandfather  d. 49   Heart disease Paternal Grandfather    Healthy Half-Sister      The patient has three boys who are cancer free.  She has a maternal half sister who is cancer free.  Both parents are living.  The patient's mother has ovarian cancer.  She has a brother who had throat cancer and four brothers who are cancer free.  Her parents are deceased from non-cancer conditions.  The patient's father had breast cancer at 55 and kidney cancer at 5.  He reportedly also has a high number of colon polyps.  He has 8 siblings who are not well known to the patient but she does not think there is cancer.  His parents died of non-cancer related issues.  [name] is unaware of previous family history of genetic testing for hereditary cancer risks. Patient's maternal ancestors are of [place] descent, and paternal  ancestors are of [place] descent. There is no reported Ashkenazi Jewish ancestry. There is no known consanguinity.  GENETIC COUNSELING ASSESSMENT: Cynthia [name] is a 37 y.o. female with a family history of cancer which is somewhat suggestive of a hereditary cancer syndrome and predisposition to cancer given the type of cancer in the family. We, therefore, discussed and recommended the following at today's visit.   DISCUSSION: We discussed that, in general, most cancer is not inherited in families, but instead is sporadic or familial. Sporadic cancers occur by chance and typically happen at older ages (>50 years) as this type of cancer is caused by genetic changes acquired during an individual's lifetime. Some families have more cancers than would be expected by chance; however, the ages or types of cancer are not consistent with a known genetic mutation or known genetic mutations have been ruled out. This type of familial cancer is thought to be due to a combination of multiple genetic, environmental, hormonal, and lifestyle factors. While this combination of factors likely increases the risk of cancer, the exact source of this risk is not currently identifiable or testable.  We discussed that up to 20% of ovarian cancer and 66% of female breast cancer is hereditary, with most cases associated with BRCA mutations.  There are other genes that can be associated with hereditary ovarian and female cancer syndromes.  These include PALB2 for female breast cancer and BRIP1, RAD51C, RAD51D and Lynch syndrome for ovarian cancer.  We discussed that testing is beneficial for several reasons including knowing how to follow individuals after completing their treatment, identifying whether potential treatment options such as PARP inhibitors would be beneficial, and understand if other family members could be at risk for cancer and allow them to undergo genetic testing.   We reviewed the characteristics, features and inheritance  patterns of hereditary cancer syndromes. We also discussed genetic testing, including the appropriate family members to test, the process of testing, insurance coverage and turn-around-time for results. We discussed the implications of a negative, positive, carrier and/or variant of uncertain significant result. Ms. Gatta  was offered an expanded pan-cancer panel (77 genes) at her GYN office and decided to pursue genetic testing for the CancerNext-Expanded+RNA gene panel.   GENETIC TEST RESULTS: Genetic testing reported through the CancerNext-Expanded+RNA cancer panel found no pathogenic mutations. The CancerNext-Expanded gene panel offered by [facility] and includes sequencing and rearrangement analysis for the following 77 genes: AIP, ALK, APC*, ATM*, AXIN2, BAP1, BARD1, BMPR1A, BRCA1*, BRCA2*, BRIP1*, CDC73, CDH1*, CDK4, CDKN1B, CDKN2A, CHEK2*, CTNNA1, DICER1, FH, FLCN, KIF1B, LZTR1, MAX, MEN1, MET, MLH1*, MSH2*, MSH3, MSH6*,  MUTYH*, NF1*, NF2, NTHL1, PALB2*, PHOX2B, PMS2*, POT1, PRKAR1A, PTCH1, PTEN*, RAD51C*, RAD51D*, RB1, RET, SDHA, SDHAF2, SDHB, SDHC, SDHD, SMAD4, SMARCA4, SMARCB1, SMARCE1, STK11, SUFU, TMEM127, TP53*, TSC1, TSC2, and VHL (sequencing and deletion/duplication); EGFR, EGLN1, HOXB13, KIT, MITF, PDGFRA, POLD1, and POLE (sequencing only); EPCAM and GREM1 (deletion/duplication only). DNA and RNA analyses performed for * genes. . The test report has been scanned into EPIC and is located under the Molecular Pathology section of the Results Review tab.  A portion of the result report is included below for reference.     We discussed with Cynthia [name] that because current genetic testing is not perfect, it is possible there may be a gene mutation in one of these genes that current testing cannot detect, but that chance is small.  We also discussed, that there could be another gene that has not yet been discovered, or that we have not yet tested, that is responsible for the cancer diagnoses  in the family. It is also possible there is a hereditary cause for the cancer in the family that Cynthia [name] did not inherit and therefore was not identified in her testing.  Therefore, it is important to remain in touch with cancer genetics in the future so that we can continue to offer Cynthia [name] the most up to date genetic testing.   Genetic testing did identify a variant of uncertain significance (VUS) was identified in the BRCA1 gene called [id number].  At this time, it is unknown if this variant is associated with increased cancer risk or if this is a normal finding, but most variants such as this get reclassified to being inconsequential. It should not be used to make medical management decisions. With time, we suspect the lab will determine the significance of this variant, if any. If we do learn more about it, we will try to contact Cynthia [name] to discuss it further. However, it is important to stay in touch with [place] periodically and keep the address and phone number up to date.  ADDITIONAL GENETIC TESTING: We discussed with Cynthia [name] that her genetic testing was fairly extensive.  If there are genes identified to increase cancer risk that can be analyzed in the future, we would be happy to discuss and coordinate this testing at that time.    CANCER SCREENING RECOMMENDATIONS: Cynthia [name] test result is considered negative (normal).  This means that we have not identified a hereditary cause for her family history of cancer at this time. Most cancers happen by chance and this negative test suggests that her cancer may fall into this category.    Possible reasons for Cynthia [name]'s negative genetic test include:  1. There may be a gene mutation in one of these genes that current testing methods cannot detect but that chance is small.  2. There could be another gene that has not yet been discovered, or that we have not yet tested, that is responsible for the cancer diagnoses in the family.  3.   There may be no hereditary risk for cancer in the family. The cancers in Cynthia [name] and/or her family may be sporadic/familial or due to other genetic and environmental factors. 4. It is also possible there is a hereditary cause for the cancer in the family that Cynthia [name] did not inherit.  Therefore, it is recommended she continue to follow the cancer management and screening guidelines provided by her primary healthcare provider. An individual's cancer risk and medical management are not determined by genetic test  results alone. Overall cancer risk assessment incorporates additional factors, including personal medical history, family history, and any available genetic information that may result in a personalized plan for cancer prevention and surveillance  Based on Cynthia [name]'s family of cancer, as well as her genetic test results, statistical models (Tyrer Cusik)  and literature data were used to estimate her risk of developing breast cancer. This estimates her lifetime risk of developing breast cancer to be approximately 16.9%.  The patient's lifetime breast cancer risk is a preliminary estimate based on available information using one of several models endorsed by the [employer] (NCCN).  The NCCN recommends consideration of breast MRI screening as an adjunct to mammography for patients at high risk (defined as 20% or greater lifetime risk). Cynthia [name] has not been placed at high risk due to her family history of cancer.  Please note that a woman's breast cancer risk changes over time. It may increase or decrease based on age and any changes to the personal and/or family medical history. The risks and recommendations listed above apply to this patient at this point in time. In the future, she may or may not be eligible for the same medical management strategies and, in some cases, other medical management strategies may become available to her. If she is interested in an  updated breast cancer risk assessment at a later date, she can contact us.     RECOMMENDATIONS FOR FAMILY MEMBERS:  Individuals in this family might be at some increased risk of developing cancer, over the general population risk, simply due to the family history of cancer.  We recommended women in this family have a yearly mammogram beginning at age 5, or 71 years younger than the earliest onset of cancer, an annual clinical breast exam, and perform monthly breast self-exams. Women in this family should also have a gynecological exam as recommended by their primary provider. All family members should be referred for colonoscopy starting at age 29.  It is also possible there is a hereditary cause for the cancer in Ms. Lafever's family that she did not inherit and therefore was not identified in her.  Based on Ms. Hogston's family history, we recommended her mother, who was diagnosed with ovarian cancer at age 87 and her father who was diagnosed with female breast cancer at the age of 54 and kidney cancer at the age of 57, have genetic counseling and testing. Ms. Scholer will let us know if we can be of any assistance in coordinating genetic counseling and/or testing for this family member.   FOLLOW-UP: Lastly, we discussed with Ms. Froman that cancer genetics is a rapidly advancing field and it is possible that new genetic tests will be appropriate for her and/or her family members in the future. We encouraged her to remain in contact with cancer genetics on an annual basis so we can update her personal and family histories and let her know of advances in cancer genetics that may benefit this family.   Our contact number was provided. Ms. Paoletti questions were answered to her satisfaction, and she knows she is welcome to call us at anytime with additional questions or concerns.   Maylon Cos, MS, [facility] Licensed, Certified Genetic Counselor [email or web address]

## 2023-01-05 ENCOUNTER — Non-Acute Institutional Stay (HOSPITAL_COMMUNITY)
Admission: RE | Admit: 2023-01-05 | Discharge: 2023-01-05 | Disposition: A | Discharge status: Home / Self Care | Payer: No Typology Code available for payment source | Source: Ambulatory Visit | Attending: Internal Medicine

## 2023-01-05 DIAGNOSIS — D649 Anemia, unspecified: Secondary | ICD-10-CM

## 2023-01-05 MED ORDER — IRON SUCROSE 500 MG IVPB - SIMPLE MED
500.0000 mg | INTRAVENOUS | Status: DC
  Administered 2023-01-05: 500 mg via INTRAVENOUS
  Filled 2023-01-05: qty 500

## 2023-01-05 MED ORDER — DIPHENHYDRAMINE HCL 25 MG PO CAPS
25.0000 mg | ORAL_CAPSULE | ORAL | Status: DC
  Administered 2023-01-05: 25 mg via ORAL
  Filled 2023-01-05: qty 1

## 2023-01-05 MED ORDER — SODIUM CHLORIDE 0.9 % IV SOLN: INTRAVENOUS | Status: DC | PRN

## 2023-01-05 MED ORDER — ACETAMINOPHEN 500 MG PO TABS
500.0000 mg | ORAL_TABLET | ORAL | Status: DC
  Administered 2023-01-05: 500 mg via ORAL
  Filled 2023-01-05: qty 1

## 2023-01-05 NOTE — Progress Notes (Signed)
PATIENT CARE CENTER NOTE   Diagnosis: Anemia, unspecified type [D64.9]    Provider: Clance Boll, DO   Procedure: Venofer infusion    Note: Patient received Venofer 500 mg infusion (dose # 1 of 2) via PIV. Pre-medications (Tylenol and Benadryl) given per order. Patient tolerated infusion well with no adverse reaction.  Patient scheduled for a procedure on next Monday and unsure if she will need second dose of Venofer. Patient will verify with her physician whether or not second dose of Venofer in required and will schedule next appointment if needed.  Discharge instructions given. Patient alert, oriented and ambulatory at discharge.

## 2023-01-07 NOTE — Anesthesia Preprocedure Evaluation (Signed)
Anesthesia Evaluation  Patient identified by MRN, date of birth, ID band Patient awake    Reviewed: Allergy & Precautions, H&P , NPO status , Patient's Chart, lab work & pertinent test results  Airway Mallampati: I  TM Distance: >3 FB Neck ROM: Full    Dental no notable dental hx. (+) Teeth Intact, Dental Advisory Given   Pulmonary neg pulmonary ROS   Pulmonary exam normal breath sounds clear to auscultation       Cardiovascular hypertension, Normal cardiovascular exam Rhythm:Regular Rate:Normal     Neuro/Psych  Headaches  Neuromuscular disease negative neurological ROS  negative psych ROS   GI/Hepatic negative GI ROS, Neg liver ROS, hiatal hernia,,,  Endo/Other  negative endocrine ROS    Renal/GU negative Renal ROSLab Results      Component                Value               Date                      CREATININE               0.73                01/03/2023                BUN                      18                  01/03/2023                NA                       138                 01/03/2023                K                        3.7                 01/03/2023                 negative genitourinary   Musculoskeletal negative musculoskeletal ROS (+)    Abdominal   Peds negative pediatric ROS (+)  Hematology negative hematology ROS (+) Blood dyscrasia, anemia Lab Results      Component                Value               Date                      WBC                      6.9                 01/03/2023                HGB                      8.7 (L)             01/03/2023  HCT                      30.9 (L)            01/03/2023                MCV                      71.0 (L)            01/03/2023                PLT                      241                 01/03/2023            T&S available    Anesthesia Other Findings ALL: cefzil, propranolaol, erythromycin, topamax   Reproductive/Obstetrics negative OB ROS                              Anesthesia Physical Anesthesia Plan  ASA: 3  Anesthesia Plan: General   Post-op Pain Management: Ketamine IV*, Lidocaine infusion* and Ofirmev IV (intra-op)*   Induction: Intravenous  PONV Risk Score and Plan: 4 or greater and Treatment may vary due to age or medical condition, Midazolam, Ondansetron and Dexamethasone  Airway Management Planned: Oral ETT  Additional Equipment: None  Intra-op Plan:   Post-operative Plan: Extubation in OR  Informed Consent: I have reviewed the patients History and Physical, chart, labs and discussed the procedure including the risks, benefits and alternatives for the proposed anesthesia with the patient or authorized representative who has indicated his/her understanding and acceptance.     Dental advisory given  Plan Discussed with:   Anesthesia Plan Comments:          Anesthesia Quick Evaluation

## 2023-01-08 ENCOUNTER — Other Ambulatory Visit: Payer: Self-pay

## 2023-01-08 ENCOUNTER — Encounter (HOSPITAL_BASED_OUTPATIENT_CLINIC_OR_DEPARTMENT_OTHER)
Admission: RE | Disposition: A | Discharge status: Home / Self Care | Payer: Self-pay | Source: Home / Self Care | Attending: Obstetrics and Gynecology

## 2023-01-08 ENCOUNTER — Ambulatory Visit (HOSPITAL_BASED_OUTPATIENT_CLINIC_OR_DEPARTMENT_OTHER): Payer: No Typology Code available for payment source | Admitting: Anesthesiology

## 2023-01-08 ENCOUNTER — Ambulatory Visit (HOSPITAL_BASED_OUTPATIENT_CLINIC_OR_DEPARTMENT_OTHER)
Admission: RE | Admit: 2023-01-08 | Discharge: 2023-01-09 | Disposition: A | Discharge status: Home / Self Care | Payer: No Typology Code available for payment source | Attending: Obstetrics and Gynecology | Admitting: Obstetrics and Gynecology

## 2023-01-08 ENCOUNTER — Encounter (HOSPITAL_BASED_OUTPATIENT_CLINIC_OR_DEPARTMENT_OTHER): Payer: Self-pay | Admitting: Obstetrics and Gynecology

## 2023-01-08 DIAGNOSIS — N939 Abnormal uterine and vaginal bleeding, unspecified: Secondary | ICD-10-CM | POA: Diagnosis present

## 2023-01-08 DIAGNOSIS — I10 Essential (primary) hypertension: Secondary | ICD-10-CM | POA: Diagnosis not present

## 2023-01-08 DIAGNOSIS — Z4889 Encounter for other specified surgical aftercare: Secondary | ICD-10-CM

## 2023-01-08 DIAGNOSIS — D649 Anemia, unspecified: Secondary | ICD-10-CM | POA: Diagnosis not present

## 2023-01-08 DIAGNOSIS — Z90710 Acquired absence of both cervix and uterus: Secondary | ICD-10-CM | POA: Insufficient documentation

## 2023-01-08 DIAGNOSIS — Z01818 Encounter for other preprocedural examination: Secondary | ICD-10-CM

## 2023-01-08 DIAGNOSIS — K660 Peritoneal adhesions (postprocedural) (postinfection): Secondary | ICD-10-CM | POA: Diagnosis not present

## 2023-01-08 HISTORY — DX: Gestational (pregnancy-induced) hypertension without significant proteinuria, unspecified trimester: O13.9

## 2023-01-08 HISTORY — PX: ROBOTIC ASSISTED LAPAROSCOPIC HYSTERECTOMY AND SALPINGECTOMY: SHX6379

## 2023-01-08 HISTORY — DX: Personal history of other diseases of the digestive system: Z87.19

## 2023-01-08 LAB — ABO/RH: ABO/RH(D): O POS

## 2023-01-08 LAB — POCT PREGNANCY, URINE: Preg Test, Ur: NEGATIVE

## 2023-01-08 LAB — TYPE AND SCREEN
ABO/RH(D): O POS
Antibody Screen: NEGATIVE

## 2023-01-08 SURGERY — XI ROBOTIC ASSISTED LAPAROSCOPIC HYSTERECTOMY AND SALPINGECTOMY
Anesthesia: General | Laterality: Bilateral

## 2023-01-08 MED ORDER — EPHEDRINE SULFATE (PRESSORS) 50 MG/ML IJ SOLN
INTRAMUSCULAR | Status: DC | PRN
  Administered 2023-01-08 (×2): 10 mg via INTRAVENOUS

## 2023-01-08 MED ORDER — ACETAMINOPHEN 500 MG PO TABS
1000.0000 mg | ORAL_TABLET | Freq: Four times a day (QID) | ORAL | Status: DC
  Administered 2023-01-08 – 2023-01-09 (×3): 1000 mg via ORAL

## 2023-01-08 MED ORDER — DEXAMETHASONE SODIUM PHOSPHATE 10 MG/ML IJ SOLN
INTRAMUSCULAR | Status: DC | PRN
  Administered 2023-01-08: 10 mg via INTRAVENOUS

## 2023-01-08 MED ORDER — OXYCODONE HCL 5 MG/5ML PO SOLN: 5.0000 mg | Freq: Once | ORAL | Status: DC | PRN

## 2023-01-08 MED ORDER — DEXAMETHASONE SODIUM PHOSPHATE 10 MG/ML IJ SOLN
INTRAMUSCULAR | Status: AC
  Filled 2023-01-08: qty 1

## 2023-01-08 MED ORDER — ROCURONIUM BROMIDE 10 MG/ML (PF) SYRINGE
PREFILLED_SYRINGE | INTRAVENOUS | Status: AC
  Filled 2023-01-08: qty 10

## 2023-01-08 MED ORDER — FENTANYL CITRATE (PF) 100 MCG/2ML IJ SOLN
INTRAMUSCULAR | Status: DC | PRN
  Administered 2023-01-08 (×2): 50 ug via INTRAVENOUS
  Administered 2023-01-08: 100 ug via INTRAVENOUS

## 2023-01-08 MED ORDER — LACTATED RINGERS IV SOLN: INTRAVENOUS | Status: DC | PRN

## 2023-01-08 MED ORDER — PANTOPRAZOLE SODIUM 40 MG PO TBEC
40.0000 mg | DELAYED_RELEASE_TABLET | Freq: Every day | ORAL | Status: DC
  Administered 2023-01-08: 40 mg via ORAL

## 2023-01-08 MED ORDER — LACTATED RINGERS IV SOLN: INTRAVENOUS | Status: DC

## 2023-01-08 MED ORDER — PROPOFOL 10 MG/ML IV BOLUS
INTRAVENOUS | Status: DC | PRN
  Administered 2023-01-08: 150 mg via INTRAVENOUS

## 2023-01-08 MED ORDER — PANTOPRAZOLE SODIUM 40 MG PO TBEC
DELAYED_RELEASE_TABLET | ORAL | Status: AC
  Filled 2023-01-08: qty 1

## 2023-01-08 MED ORDER — ONDANSETRON HCL 4 MG/2ML IJ SOLN: 4.0000 mg | Freq: Once | INTRAMUSCULAR | Status: DC | PRN

## 2023-01-08 MED ORDER — ONDANSETRON HCL 4 MG/2ML IJ SOLN
INTRAMUSCULAR | Status: AC
  Filled 2023-01-08: qty 2

## 2023-01-08 MED ORDER — MIDAZOLAM HCL 2 MG/2ML IJ SOLN
INTRAMUSCULAR | Status: AC
  Filled 2023-01-08: qty 2

## 2023-01-08 MED ORDER — MENTHOL 3 MG MT LOZG
LOZENGE | OROMUCOSAL | Status: AC
  Filled 2023-01-08: qty 9

## 2023-01-08 MED ORDER — ONDANSETRON HCL 4 MG/2ML IJ SOLN
INTRAMUSCULAR | Status: DC | PRN
  Administered 2023-01-08: 4 mg via INTRAVENOUS

## 2023-01-08 MED ORDER — SIMETHICONE 80 MG PO CHEW: 80.0000 mg | CHEWABLE_TABLET | Freq: Four times a day (QID) | ORAL | Status: DC | PRN

## 2023-01-08 MED ORDER — OXYCODONE HCL 5 MG PO TABS
ORAL_TABLET | ORAL | Status: AC
  Filled 2023-01-08: qty 2

## 2023-01-08 MED ORDER — MENTHOL 3 MG MT LOZG: 1.0000 | LOZENGE | OROMUCOSAL | Status: DC | PRN

## 2023-01-08 MED ORDER — FENTANYL CITRATE (PF) 100 MCG/2ML IJ SOLN
INTRAMUSCULAR | Status: AC
  Filled 2023-01-08: qty 2

## 2023-01-08 MED ORDER — CEFAZOLIN SODIUM-DEXTROSE 2-4 GM/100ML-% IV SOLN
INTRAVENOUS | Status: AC
  Filled 2023-01-08: qty 100

## 2023-01-08 MED ORDER — BUPIVACAINE HCL (PF) 0.25 % IJ SOLN
INTRAMUSCULAR | Status: DC | PRN
  Administered 2023-01-08: 30 mL

## 2023-01-08 MED ORDER — OXYCODONE HCL 5 MG PO TABS: 5.0000 mg | ORAL_TABLET | Freq: Once | ORAL | Status: DC | PRN

## 2023-01-08 MED ORDER — ONDANSETRON HCL 4 MG/2ML IJ SOLN: 4.0000 mg | Freq: Four times a day (QID) | INTRAMUSCULAR | Status: DC | PRN

## 2023-01-08 MED ORDER — ROCURONIUM BROMIDE 10 MG/ML (PF) SYRINGE
PREFILLED_SYRINGE | INTRAVENOUS | Status: DC | PRN
  Administered 2023-01-08: 20 mg via INTRAVENOUS
  Administered 2023-01-08: 15 mg via INTRAVENOUS
  Administered 2023-01-08: 40 mg via INTRAVENOUS
  Administered 2023-01-08: 60 mg via INTRAVENOUS
  Administered 2023-01-08: 20 mg via INTRAVENOUS

## 2023-01-08 MED ORDER — PHENYLEPHRINE 80 MCG/ML (10ML) SYRINGE FOR IV PUSH (FOR BLOOD PRESSURE SUPPORT)
PREFILLED_SYRINGE | INTRAVENOUS | Status: AC
  Filled 2023-01-08: qty 20

## 2023-01-08 MED ORDER — ONDANSETRON HCL 4 MG PO TABS: 4.0000 mg | ORAL_TABLET | Freq: Four times a day (QID) | ORAL | Status: DC | PRN

## 2023-01-08 MED ORDER — IBUPROFEN 200 MG PO TABS
600.0000 mg | ORAL_TABLET | Freq: Four times a day (QID) | ORAL | Status: DC
  Administered 2023-01-09: 600 mg via ORAL

## 2023-01-08 MED ORDER — PHENYLEPHRINE 80 MCG/ML (10ML) SYRINGE FOR IV PUSH (FOR BLOOD PRESSURE SUPPORT)
PREFILLED_SYRINGE | INTRAVENOUS | Status: DC | PRN
  Administered 2023-01-08: 160 ug via INTRAVENOUS

## 2023-01-08 MED ORDER — LIDOCAINE 2% (20 MG/ML) 5 ML SYRINGE
INTRAMUSCULAR | Status: DC | PRN
  Administered 2023-01-08: 100 mg via INTRAVENOUS

## 2023-01-08 MED ORDER — CEFAZOLIN SODIUM-DEXTROSE 2-3 GM-%(50ML) IV SOLR
INTRAVENOUS | Status: DC | PRN
  Administered 2023-01-08: 2 g via INTRAVENOUS

## 2023-01-08 MED ORDER — POVIDONE-IODINE 10 % EX SWAB: 2.0000 | Freq: Once | CUTANEOUS | Status: DC

## 2023-01-08 MED ORDER — OXYCODONE HCL 5 MG PO TABS
5.0000 mg | ORAL_TABLET | ORAL | Status: DC | PRN
  Administered 2023-01-08: 5 mg via ORAL
  Administered 2023-01-08 – 2023-01-09 (×3): 10 mg via ORAL

## 2023-01-08 MED ORDER — ACETAMINOPHEN 500 MG PO TABS
ORAL_TABLET | ORAL | Status: AC
  Filled 2023-01-08: qty 2

## 2023-01-08 MED ORDER — MIDAZOLAM HCL 2 MG/2ML IJ SOLN
INTRAMUSCULAR | Status: DC | PRN
  Administered 2023-01-08: 2 mg via INTRAVENOUS

## 2023-01-08 MED ORDER — LIDOCAINE HCL (PF) 2 % IJ SOLN
INTRAMUSCULAR | Status: AC
  Filled 2023-01-08: qty 5

## 2023-01-08 MED ORDER — CEFAZOLIN SODIUM-DEXTROSE 2-4 GM/100ML-% IV SOLN: 2.0000 g | INTRAVENOUS | Status: DC

## 2023-01-08 MED ORDER — PROPOFOL 10 MG/ML IV BOLUS
INTRAVENOUS | Status: AC
  Filled 2023-01-08: qty 20

## 2023-01-08 MED ORDER — HYDROMORPHONE HCL 1 MG/ML IJ SOLN: 0.2500 mg | INTRAMUSCULAR | Status: DC | PRN

## 2023-01-08 MED ORDER — KETOROLAC TROMETHAMINE 30 MG/ML IJ SOLN
INTRAMUSCULAR | Status: AC
  Filled 2023-01-08: qty 1

## 2023-01-08 MED ORDER — SODIUM CHLORIDE 0.9 % IV SOLN
INTRAVENOUS | Status: DC | PRN
  Administered 2023-01-08: 60 mL

## 2023-01-08 MED ORDER — KETAMINE HCL 50 MG/ML IJ SOLN
INTRAMUSCULAR | Status: DC | PRN
  Administered 2023-01-08 (×2): 10 mg via INTRAMUSCULAR
  Administered 2023-01-08: 20 mg via INTRAMUSCULAR
  Administered 2023-01-08: 10 mg via INTRAMUSCULAR

## 2023-01-08 MED ORDER — KETOROLAC TROMETHAMINE 30 MG/ML IJ SOLN
30.0000 mg | Freq: Once | INTRAMUSCULAR | Status: AC | PRN
  Administered 2023-01-08: 30 mg via INTRAVENOUS

## 2023-01-08 MED ORDER — OXYCODONE HCL 5 MG PO TABS
ORAL_TABLET | ORAL | Status: AC
  Filled 2023-01-08: qty 1

## 2023-01-08 MED ORDER — KETAMINE HCL 50 MG/5ML IJ SOSY
PREFILLED_SYRINGE | INTRAMUSCULAR | Status: AC
  Filled 2023-01-08: qty 5

## 2023-01-08 MED ORDER — HYDROMORPHONE HCL 1 MG/ML IJ SOLN: 0.2000 mg | INTRAMUSCULAR | Status: DC | PRN

## 2023-01-08 MED ORDER — SUGAMMADEX SODIUM 200 MG/2ML IV SOLN
INTRAVENOUS | Status: DC | PRN
  Administered 2023-01-08: 200 mg via INTRAVENOUS

## 2023-01-08 MED ORDER — POLYETHYLENE GLYCOL 3350 17 G PO PACK: 17.0000 g | PACK | Freq: Every day | ORAL | Status: DC | PRN

## 2023-01-08 SURGICAL SUPPLY — 64 items
ADH SKN CLS APL DERMABOND .7 (GAUZE/BANDAGES/DRESSINGS) ×1
APL PRP STRL LF DISP 70% ISPRP (MISCELLANEOUS) ×1
APL SRG 38 LTWT LNG FL B (MISCELLANEOUS)
APPLICATOR ARISTA FLEXITIP XL (MISCELLANEOUS) IMPLANT
BARRIER ADHS 3X4 INTERCEED (GAUZE/BANDAGES/DRESSINGS) IMPLANT
BRR ADH 4X3 ABS CNTRL BYND (GAUZE/BANDAGES/DRESSINGS)
CHLORAPREP W/TINT 26 (MISCELLANEOUS) ×1 IMPLANT
COVER BACK TABLE 60X90IN (DRAPES) ×1 IMPLANT
COVER TIP SHEARS 8 DVNC (MISCELLANEOUS) ×1 IMPLANT
DEFOGGER SCOPE WARMER CLEARIFY (MISCELLANEOUS) ×1 IMPLANT
DERMABOND ADVANCED .7 DNX12 (GAUZE/BANDAGES/DRESSINGS) ×1 IMPLANT
DRAPE ARM DVNC X/XI (DISPOSABLE) ×4 IMPLANT
DRAPE COLUMN DVNC XI (DISPOSABLE) ×1 IMPLANT
DRAPE SURG IRRIG POUCH 19X23 (DRAPES) ×1 IMPLANT
DRAPE UTILITY XL STRL (DRAPES) ×1 IMPLANT
DRIVER NDL MEGA SUTCUT DVNCXI (INSTRUMENTS) ×1 IMPLANT
DRIVER NDLE MEGA SUTCUT DVNCXI (INSTRUMENTS) ×1 IMPLANT
ELECT REM PT RETURN 9FT ADLT (ELECTROSURGICAL) ×1
ELECTRODE REM PT RTRN 9FT ADLT (ELECTROSURGICAL) ×1 IMPLANT
FORCEPS BPLR FENES DVNC XI (FORCEP) ×1 IMPLANT
FORCEPS LONG TIP 8 DVNC XI (FORCEP) ×1 IMPLANT
FORCEPS PROGRASP DVNC XI (FORCEP) ×1 IMPLANT
GAUZE 4X4 16PLY ~~LOC~~+RFID DBL (SPONGE) IMPLANT
GAUZE PETROLATUM 1 X8 (GAUZE/BANDAGES/DRESSINGS) IMPLANT
GLOVE BIO SURGEON STRL SZ 6.5 (GLOVE) ×3 IMPLANT
GLOVE BIOGEL PI IND STRL 7.0 (GLOVE) ×3 IMPLANT
HEMOSTAT ARISTA ABSORB 3G PWDR (HEMOSTASIS) IMPLANT
HOLDER FOLEY CATH W/STRAP (MISCELLANEOUS) IMPLANT
IRRIG SUCT STRYKERFLOW 2 WTIP (MISCELLANEOUS) ×1
IRRIGATION SUCT STRKRFLW 2 WTP (MISCELLANEOUS) ×1 IMPLANT
IV NS 1000ML (IV SOLUTION) ×1
IV NS 1000ML BAXH (IV SOLUTION) ×1 IMPLANT
KIT PINK PAD W/HEAD ARE REST (MISCELLANEOUS) ×1
KIT PINK PAD W/HEAD ARM REST (MISCELLANEOUS) ×1 IMPLANT
KIT TURNOVER CYSTO (KITS) ×1 IMPLANT
LEGGING LITHOTOMY PAIR STRL (DRAPES) ×1 IMPLANT
MANIPULATOR ADVINCU DEL 3.5 PL (MISCELLANEOUS) IMPLANT
MANIPULATOR VCARE LG CRV RETR (MISCELLANEOUS) IMPLANT
MANIPULATOR VCARE SML CRV RETR (MISCELLANEOUS) IMPLANT
MANIPULATOR VCARE STD CRV RETR (MISCELLANEOUS) IMPLANT
NDL INSUFFLATION 14GA 120MM (NEEDLE) ×1 IMPLANT
NEEDLE INSUFFLATION 14GA 120MM (NEEDLE) ×1 IMPLANT
OBTURATOR OPTICAL STND 8 DVNC (TROCAR) ×1
OBTURATOR OPTICALSTD 8 DVNC (TROCAR) ×1 IMPLANT
OCCLUDER COLPOPNEUMO (BALLOONS) ×1 IMPLANT
PACK ROBOT WH (CUSTOM PROCEDURE TRAY) ×1 IMPLANT
PACK ROBOTIC GOWN (GOWN DISPOSABLE) ×1 IMPLANT
PAD OB MATERNITY 4.3X12.25 (PERSONAL CARE ITEMS) ×1 IMPLANT
PAD PREP 24X48 CUFFED NSTRL (MISCELLANEOUS) ×1 IMPLANT
PROTECTOR NERVE ULNAR (MISCELLANEOUS) ×2 IMPLANT
SCISSORS MNPLR CVD DVNC XI (INSTRUMENTS) ×1 IMPLANT
SEAL UNIV 5-12 XI (MISCELLANEOUS) ×4 IMPLANT
SET TRI-LUMEN FLTR TB AIRSEAL (TUBING) ×1 IMPLANT
SLEEVE SCD COMPRESS KNEE MED (STOCKING) ×1 IMPLANT
SPIKE FLUID TRANSFER (MISCELLANEOUS) ×2 IMPLANT
SUT VIC AB 0 CT1 27 (SUTURE) ×2
SUT VIC AB 0 CT1 27XBRD ANBCTR (SUTURE) ×2 IMPLANT
SUT VIC AB 4-0 PS2 27 (SUTURE) ×2 IMPLANT
SUT VICRYL 0 UR6 27IN ABS (SUTURE) IMPLANT
SUT VLOC 180 0 9IN  GS21 (SUTURE) ×1
SUT VLOC 180 0 9IN GS21 (SUTURE) ×1 IMPLANT
TOWEL OR 17X24 6PK STRL BLUE (TOWEL DISPOSABLE) ×1 IMPLANT
TRAY FOLEY W/BAG SLVR 14FR LF (SET/KITS/TRAYS/PACK) ×1 IMPLANT
TROCAR PORT AIRSEAL 5X120 (TROCAR) ×1 IMPLANT

## 2023-01-08 NOTE — Anesthesia Postprocedure Evaluation (Signed)
Anesthesia Post Note  Patient: Tammie R Pociask  Procedure(s) Performed: XI ROBOTIC ASSISTED LAPAROSCOPIC HYSTERECTOMY AND SALPINGECTOMY (Bilateral)     Patient location during evaluation: PACU Anesthesia Type: General Level of consciousness: awake and alert Pain management: pain level controlled Vital Signs Assessment: post-procedure vital signs reviewed and stable Respiratory status: spontaneous breathing, nonlabored ventilation, respiratory function stable and patient connected to nasal cannula oxygen Cardiovascular status: blood pressure returned to baseline and stable Postop Assessment: no apparent nausea or vomiting Anesthetic complications: no  No notable events documented.  Last Vitals:  Vitals:   01/08/23 1816 01/08/23 1843  BP: (!) 149/91 (!) 145/83  Pulse: 82 74  Resp: 16 15  Temp: 36.8 C 36.5 C  SpO2: 100% 100%    Last Pain:  Vitals:   01/08/23 1828  TempSrc:   PainSc: 6                  Shelton Silvas

## 2023-01-08 NOTE — Brief Op Note (Signed)
01/08/2023  5:28 PM  PATIENT:  Cynthia [name]  37 y.o. female  PRE-OPERATIVE DIAGNOSIS:  Abnormal Uterine Bleeding  POST-OPERATIVE DIAGNOSIS:  Abnormal Uterine Bleeding  PROCEDURE:  Procedure(s): XI ROBOTIC ASSISTED LAPAROSCOPIC HYSTERECTOMY AND SALPINGECTOMY (Bilateral)  SURGEON:  Surgeon(s) and Role:    * Dyllon Henken A, DO - Primary    * Shea Evans, MD - Assisting  PHYSICIAN ASSISTANT:   ASSISTANTS: none   ANESTHESIA:   local and general  EBL:  50 mL   BLOOD ADMINISTERED:none  DRAINS: Urinary Catheter (Foley)   LOCAL MEDICATIONS USED:  MARCAINE     SPECIMEN:  Source of Specimen:  uterus, cervix, portion of bilateral fallopian tubes  DISPOSITION OF SPECIMEN:  PATHOLOGY  COUNTS:  YES  TOURNIQUET:  * No tourniquets in log *  DICTATION: .Note written in EPIC  PLAN OF CARE: Admit for overnight observation  PATIENT DISPOSITION:  PACU - hemodynamically stable.   Delay start of Pharmacological VTE agent (>24hrs) due to surgical blood loss or risk of bleeding: not applicable  Cynthia [name] A Cynthia [name] 01/08/23 5:31 PM

## 2023-01-08 NOTE — Op Note (Signed)
Operative Note  Cynthia [name] [id number] 1986-02-17   PROCEDURE: robotic assisted total laparoscopic hysterectomy, bilateral salpingectomy  PRE-OPERATIVE DIAGNOSIS: 1. Abnormal uterine bleeding  POST-OPERATIVE DIAGNOSIS: 1. Abnormal uterine bleeding  SURGEON: Dr. Clance Boll, DO  ASSISTANT: Dr. Shea Evans, MD  FINDINGS: normal appearing external female genitalia, normal appearing ectocervix without lesions. Laparoscopy reveals omental adhesions at the midline, normal appearing upper abdominal survey of liver edge with views limited by adhesions, normal appearing female pelvic anatomy including small uterus with smooth contours, normal appearing bilateral fallopian tubes with evidence of previous tubal ligation, and normal appearing bilateral ovaries, minimal bladder adhesions to lower uterine segment  SPECIMENS: uterus, cervix, portions of bilateral fallopian tubes  EBL: 50 cc  FLUIDS: 1,200 cc crystalloids  UOP: 975 cc clear  COMPLICATIONS: None  PROCEDURE IN DETAIL:   After the patient was appropriately consented in the holding area, she was taken to the operating room where general anesthesia was administered without complications. The patient was placed in the dorsal lithotomy position. Bilateral arms were tucked with the appropriate barrier padding. The patient was prepped and draped in the usual sterile fashion. An appropriate time out was performed that verified the correct patient, procedure, and surgical team.   Attention was made to the pelvis first. A sterile speculum was placed inside the vagina and the cervix was visualized. A single tooth tenaculum was used to grasp the anterior lip of the cervix. An 0-Vicryl stay suture was placed at the anterior portion of the cervix and again on the posterior aspect. The uterus was sounded to 10 cm. The cervix was serially dilated to accommodate the uterine manipulator, which was then advanced to the fundus and the colpotomy  ring secured into place. The Foley catheter was then placed into the bladder which was drained of clear yellow urine and remained in place for the duration of the procedure.   Attention was turned to the abdomen where the laparoscopic equipment had been assembled. Surgical landmarks identified as patient had multiple previous abdominal surgeries. The previous incision at the umbilicus location was utilized for entry. An initial attempt was made using the Veress needle. However after first two attempts were unsuccessful with higher pressure readings noted and subcutaneous gas infiltration, the decision was made to convert to an open abdominal entry. Abdominal entry was complicated by patient's previous umbilical hernia repair with mesh placement. Eventually the fascia was able to be identified, tented upward and opened with Mayo scissors. An 0-Vicryl suture was placed circumferentially. The peritoneum was eventually identified, tented upward and entered sharply with Mayo scissors. Digital inspection confirmed intraperitoneal location. The robotic 8 mm trocar was advanced through the incision and pneumoperitoneum was obtained without complications to a set pressure of 15 mmHg. The laparoscope was advanced and inspection of the area below revealed no gross damage to the underlying structures. The patient was placed into Trendelenburg positioning to aid in visualization and inspection of the abdomen revealed the findings as noted above. The remaining port sites were placed under laparoscopic visualization, utilizing 1 additional robotic trocar on the left and 2 additional robotic trocars on the right. A 5 mm AirSeal assist port was also placed on the left. The robot was then moved into place and the robotic arms connected to the trocars. Targeting performed and robotic instruments introduced under direct visualization.   Attention was then turned to the robotic console. The left fallopian tube was then dissected off  starting at the distal end moving proximally. The tube was  transected at its uterine attachment and removed intact through the assistant port. The left utero-ovarian ligament was transected and the left ovary freed of its uterine attachments. The left round ligament was then desiccated and transected. The anterior leaf of the broad ligament was opened up and dissected off moving inferiorly towards the cervix. The bladder flap was started anteriorly. Attention was then turned to the right side and dissection performed in a similar fashion. The anterior bladder flap was then further developed until the white cervicovesical fascia was appreciated. The bilateral uterine arteries were skeletonized and desiccated at the level of internal cervical os. The colpotomy was performed circumferentially at the cervicovaginal junction using the monopolar scissors until the blue uterine manipulator colpotomy cup was seen. The uterus and cervix was then removed through the vagina. All specimens were sent for final pathology. The vaginal cuff was closed in the usual running fashion using V-loc. Digital inspection of the vaginal cuff confirmed complete closure. Adequate hemostasis of the vaginal cuff was appreciated. Additional local anesthetic was placed into the pelvis.    The robotic instruments were all removed and the robot undocked. The trocars were removed under laparoscopic visualization. The AirSeal was turned off and the abdomen allowed to desufflate. The umbilical incision closure was performed using 0-Vicryl incorporating the peritoneum to ensure adequate restoration of previous mesh repair. The previously placed 0-Vicryl fascia sutures was secured down to follow. The skin incisions were closed with 4-0 Vicryl and skin glue. The Foley catheter remained in place and inspection of the vagina revealed no vaginal mucosa lacerations. The patient tolerated the procedure well and was taken to the recovery area in stable  condition. All instrument, needle, and lap counts were correct.   Cynthia [name] 01/08/23 8:15 PM

## 2023-01-08 NOTE — Progress Notes (Signed)
Upon admission to [facility], pt c/o severe 5/10 pressure to bladder, states "it feels like I have to pee and I can't stand it". Kpad applied to abdomen, tylenol 1 gram and oxycodone 5 mg given per MD order. Clear/yellow urine noted in foley catheter, peripad C/D/I. Pt ambulated to bed from stretcher with 2 RN assist, unsteady gait. Informed pt current MD order to remove foley after pt ambulating well. Pt states "I want this catheter out now, I've had many surgeries before and the pain is coming from the catheter". Discussed risk of removing catheter early before pt has ambulated and tolerated PO intake as well as risk of possibility of need for another foley catheter if pt is unable to void. Pt verbalized understanding and states "I want the catheter out now". Catheter removed, pt assisted to BR, unable to void. Will continue to monitor.   Frances Furbish, RN

## 2023-01-08 NOTE — OR Nursing (Signed)
Updated family on surgery status

## 2023-01-08 NOTE — Anesthesia Procedure Notes (Signed)
Procedure Name: Intubation Date/Time: 01/08/2023 1:29 PM  Performed by: Francie Massing, CRNAPre-anesthesia Checklist: Patient identified, Emergency Drugs available, Suction available and Patient being monitored Patient Re-evaluated:Patient Re-evaluated prior to induction Oxygen Delivery Method: Circle system utilized Preoxygenation: Pre-oxygenation with 100% oxygen Induction Type: IV induction Ventilation: Mask ventilation without difficulty Laryngoscope Size: Miller and 2 Grade View: Grade I Tube type: Oral Tube size: 7.0 mm Number of attempts: 1 Airway Equipment and Method: Stylet and Oral airway Placement Confirmation: ETT inserted through vocal cords under direct vision, positive ETCO2 and breath sounds checked- equal and bilateral Secured at: 21 cm Tube secured with: Tape Dental Injury: Teeth and Oropharynx as per pre-operative assessment

## 2023-01-08 NOTE — Transfer of Care (Signed)
Immediate Anesthesia Transfer of Care Note  Patient: Cynthia [name]  Procedure(s) Performed: XI ROBOTIC ASSISTED LAPAROSCOPIC HYSTERECTOMY AND SALPINGECTOMY (Bilateral)  Patient Location: PACU  Anesthesia Type:General  Level of Consciousness: awake and alert   Airway & Oxygen Therapy: Patient Spontanous Breathing and Patient connected to face mask oxygen  Post-op Assessment: Report given to RN and Post -op Vital signs reviewed and stable  Post vital signs: Reviewed and stable  Last Vitals:  Vitals Value Taken Time  BP 133/93 01/08/23 1734  Temp    Pulse 98 01/08/23 1736  Resp 17 01/08/23 1736  SpO2 100 % 01/08/23 1736  Vitals shown include unvalidated device data.  Last Pain:  Vitals:   01/08/23 1206  TempSrc: Oral  PainSc: 0-No pain      Patients Stated Pain Goal: 5 (01/08/23 1206)  Complications: No notable events documented.

## 2023-01-08 NOTE — H&P (Signed)
Cynthia [name] is an 38 y.o. female presenting for scheduled surgery with total laparoscopic hysterectomy for AUB  Patient is a 67Y G58P3 female with periods progressively worsening over the past several years Cycles now extremely heavy lasting a week and passing palm sized clots, bleeds onto towels  Does have some cramping but not as bothersome as the bleeding Had tried and failed medical management with TXA with previous GYN Is not interested in hormonal contraception due to history of unwanted side effects in the past Previously had Mirena- felt hormones was causing her to have fatigue and weight gain, had removed and states she instantly felt better S/p EBx 10/12/22 benign S/p TVUS -retroverted uterus 9.4x6.3x4.9cm ES 8.33mm, normal b/l ovaries, trace FF PCDS  11/20/22 In office Hysteroscopy, D&C planned Novasure ablation unable to be completed d/t uterine cavity length < 4 cm  -D&C Path BENIGN  Desires hysterectomy Strong family hx of endometriosis but has never been diagnosed herself  Hx of c/s x2 and SVD x1  Hx of gastric sleeve and BTL for additional abdominal surgeries S/p Genetic testing for family hx: +BRCA VUS, has GC scheduled next month  No additional medical problems besides migraines takes triptan PRN     Menstrual History: Patient's last menstrual period was 01/01/2023.    Past Medical History:  Diagnosis Date   Family history of breast cancer in female    Family history of ovarian cancer    Gestational HTN    History of hiatal hernia    repaired in 2022   Migraines    Follows w/ Lawerance Sabal, [place].    Past Surgical History:  Procedure Laterality Date   CESAREAN SECTION     x 2   LAPAROSCOPIC GASTRIC SLEEVE RESECTION  02/2021   with hiatal hernia repair   TUBAL LIGATION  07/10/2017   UMBILICAL HERNIA REPAIR N/A 01/16/2022   Procedure: HERNIA REPAIR UMBILICAL ADULT; OPEN WITH MESH;  Surgeon: [name], MD;  Location: [facility];  Service: General;   Laterality: N/A;    Family History  Problem Relation Age of Onset   Ovarian cancer Mother 71   Breast cancer Father 45   Kidney cancer Father 57   Colon polyps Father    Throat cancer Maternal Uncle    Schizophrenia Maternal Grandmother        d. 49s   Heart disease Maternal Grandfather        d. 13   Heart disease Paternal Grandfather    Healthy Half-Sister     Social History:  reports that she has never smoked. She has never been exposed to tobacco smoke. She has never used smokeless tobacco. She reports that she does not drink alcohol and does not use drugs.  Allergies:  Allergies  Allergen Reactions   Anchovies [Fish Allergy] Anaphylaxis   Cefzil [Cefprozil] Other (See Comments)    Metal taste in mouth   Erythromycin Other (See Comments)    Metal taste in mouth   Inderal [Propranolol] Other (See Comments)    Fatigue and jitteriness   Topamax [Topiramate] Other (See Comments)    Difficulty focusing and speaking    Medications Prior to Admission  Medication Sig Dispense Refill Last Dose   valACYclovir (VALTREX) 500 MG tablet Take 500 mg by mouth daily.   01/05/2023    Review of Systems  All other systems reviewed and are negative.   Blood pressure (!) 142/90, pulse (!) 54, temperature 98 F (36.7 C), temperature source Oral, resp. rate 18,  height 5\' 4"  (1.626 m), weight 73.8 kg, last menstrual period 01/01/2023, SpO2 100 %, not currently breastfeeding. Physical Exam Vitals reviewed.  Constitutional:      Appearance: She is normal weight.  HENT:     Head: Normocephalic.  Cardiovascular:     Rate and Rhythm: Normal rate.  Pulmonary:     Effort: Pulmonary effort is normal.  Abdominal:     Palpations: Abdomen is soft.     Tenderness: There is no abdominal tenderness.  Genitourinary:    General: Normal vulva.  Musculoskeletal:        General: Normal range of motion.     Cervical back: Normal range of motion.  Skin:    General: Skin is warm and dry.   Neurological:     General: No focal deficit present.     Mental Status: She is alert and oriented to person, place, and time.  Psychiatric:        Mood and Affect: Mood normal.        Behavior: Behavior normal.     Results for orders placed or performed during the hospital encounter of 01/08/23 (from the past 24 hour(s))  Pregnancy, urine POC     Status: None   Collection Time: 01/08/23 11:48 AM  Result Value Ref Range   Preg Test, Ur NEGATIVE NEGATIVE  ABO/Rh     Status: None (Preliminary result)   Collection Time: 01/08/23 12:33 PM  Result Value Ref Range   ABO/RH(D) PENDING     No results found.  Assessment/Plan:  36Y P3 with AUB failed medical management consented for robotic assisted total laparoscopic hysterectomy with bilateral salpingectomy  Patient has been thoroughly counseled both in the office and again in the preoperative holding unit on the risks and benefits of surgery. She is aware surgical risks include but not limited to bleeding, infection, damage to surrounding structures, VTE, and inherit risks of anesthesia. Patient consents to blood transfusion if clinically indicated. Patient verbalizes good understanding of risks, questions answered, and consents signed together.   -Admit to OR -NPO -Ancef 2g IV -SCD VTE ppx -UPT NEG -Plan for DC home tmw vs tmw POD1 pending PACU recovery d/w pt -Routine intra-op/postop care  Manning Luna A Demarquis Osley 01/08/2023, 1:11 PM

## 2023-01-09 DIAGNOSIS — N939 Abnormal uterine and vaginal bleeding, unspecified: Secondary | ICD-10-CM | POA: Diagnosis not present

## 2023-01-09 DIAGNOSIS — Z4889 Encounter for other specified surgical aftercare: Secondary | ICD-10-CM

## 2023-01-09 DIAGNOSIS — Z90710 Acquired absence of both cervix and uterus: Secondary | ICD-10-CM | POA: Insufficient documentation

## 2023-01-09 LAB — CBC
HCT: 28.4 % — ABNORMAL LOW (ref 36.0–46.0)
Hemoglobin: 8.2 g/dL — ABNORMAL LOW (ref 12.0–15.0)
MCH: 20.3 pg — ABNORMAL LOW (ref 26.0–34.0)
MCHC: 28.9 g/dL — ABNORMAL LOW (ref 30.0–36.0)
MCV: 70.5 fL — ABNORMAL LOW (ref 80.0–100.0)
Platelets: 278 10*3/uL (ref 150–400)
RBC: 4.03 MIL/uL (ref 3.87–5.11)
RDW: 18.8 % — ABNORMAL HIGH (ref 11.5–15.5)
WBC: 10.1 10*3/uL (ref 4.0–10.5)
nRBC: 0 % (ref 0.0–0.2)

## 2023-01-09 MED ORDER — ACETAMINOPHEN 500 MG PO TABS
ORAL_TABLET | ORAL | Status: AC
  Filled 2023-01-09: qty 2

## 2023-01-09 MED ORDER — SIMETHICONE 80 MG PO CHEW: 80.0000 mg | CHEWABLE_TABLET | Freq: Four times a day (QID) | ORAL | 0 refills | Status: AC | PRN

## 2023-01-09 MED ORDER — OXYCODONE HCL 5 MG PO TABS
ORAL_TABLET | ORAL | Status: AC
  Filled 2023-01-09: qty 2

## 2023-01-09 MED ORDER — ONDANSETRON HCL 4 MG/2ML IJ SOLN
INTRAMUSCULAR | Status: AC
  Filled 2023-01-09: qty 2

## 2023-01-09 MED ORDER — POLYETHYLENE GLYCOL 3350 17 G PO PACK: 17.0000 g | PACK | Freq: Every day | ORAL | 0 refills | Status: AC | PRN

## 2023-01-09 MED ORDER — IBUPROFEN 200 MG PO TABS
ORAL_TABLET | ORAL | Status: AC
  Filled 2023-01-09: qty 3

## 2023-01-09 MED ORDER — IBUPROFEN 800 MG PO TABS: 800.0000 mg | ORAL_TABLET | Freq: Three times a day (TID) | ORAL | 0 refills | Status: AC

## 2023-01-09 MED ORDER — OXYCODONE HCL 5 MG PO TABS: 5.0000 mg | ORAL_TABLET | ORAL | 0 refills | Status: AC | PRN

## 2023-01-09 NOTE — Discharge Summary (Signed)
Physician Discharge Summary  Patient ID: Cynthia [name] MRN: [id number] DOB/AGE: Apr 21, 1986 37 y.o.  Admit date: 01/08/2023 Discharge date: 01/09/2023  Admission Diagnoses:  Discharge Diagnoses:  Principal Problem:   Status post laparoscopic hysterectomy 01/08/23 Active Problems:   Abnormal uterine bleeding   Encounter for postoperative care   Discharged Condition: good  Hospital Course: 01/08/23: Patient 36Y P3 with AUB underwent scheduled Ra-TLH with b/l salpingectomy. Please see operative note for complete details. Patient was kept for overnight observation due to late end time of surgery 01/09/23: POD#1 Patient doing well meeting postop milestones. She has been well controlled with oral pain medications. She has been ambulating without dizziness. She has been spontaneously voiding s/p Foley catheter removal overnight. Has not yet passed gas feels things are moving. One episode of emesis overnight after pain medications but no nausea and is tolerating a regular diet this morning. VB has been light. No CP, SOB, or HA. Discharge to home today.    Consults: None  Significant Diagnostic Studies: labs:     Latest Ref Rng & Units 01/09/2023    1:54 AM 01/03/2023    1:12 PM 10/28/2014    3:51 PM  CBC  WBC 4.0 - 10.5 K/uL 10.1  6.9  11.5   Hemoglobin 12.0 - 15.0 g/dL 8.2  8.7  78.2   Hematocrit 36.0 - 46.0 % 28.4  30.9  39.6   Platelets 150 - 400 K/uL 278  241  311      Treatments: surgery: Robotic assisted total laparoscopic hysterectomy with bilateral salpingectomy  Discharge Exam: Blood pressure (!) 144/87, pulse 63, temperature 98.1 F (36.7 C), resp. rate 18, height 5\' 4"  (1.626 m), weight 73.8 kg, last menstrual period 01/01/2023, SpO2 100 %, not currently breastfeeding. General appearance: alert and no distress Head: Normocephalic, without obvious abnormality, atraumatic Resp: clear to auscultation bilaterally Cardio: regular rate and rhythm, S1, S2 normal, no murmur, click,  rub or gallop GI: soft, non-tender; bowel sounds normal; no masses,  no organomegaly Skin: Skin color, texture, turgor normal. No rashes or lesions Incision/Wound: abdominal incisions x5 c/d/i  Disposition: Discharge disposition: 01-Home or Self Care       Discharge Instructions     Call MD for:   Complete by: As directed    Call MD for:  difficulty breathing, headache or visual disturbances   Complete by: As directed    Call MD for:  extreme fatigue   Complete by: As directed    Call MD for:  hives   Complete by: As directed    Call MD for:  persistant dizziness or light-headedness   Complete by: As directed    Call MD for:  persistant nausea and vomiting   Complete by: As directed    Call MD for:  redness, tenderness, or signs of infection (pain, swelling, redness, odor or green/yellow discharge around incision site)   Complete by: As directed    Call MD for:  severe uncontrolled pain   Complete by: As directed    Call MD for:  temperature >100.4   Complete by: As directed    Diet - low sodium heart healthy   Complete by: As directed    Discharge instructions   Complete by: As directed    Increase activity slowly as tolerated. Encourage daily walking and may use stairs. No heavy lifting greater than 10 pounds for 4-6 weeks. Pelvic rest for 6 weeks. May shower. No soaking in a bath, tub, or pool. Regular diet as tolerated.  Keep incisions clean and dry. Call office with any heavy vaginal bleeding saturating a pad in less than an hour or any other concerns.   Increase activity slowly   Complete by: As directed       Allergies as of 01/09/2023       Reactions   Anchovies [fish Allergy] Anaphylaxis   Cefzil [cefprozil] Other (See Comments)   Metal taste in mouth   Erythromycin Other (See Comments)   Metal taste in mouth   Inderal [propranolol] Other (See Comments)   Fatigue and jitteriness   Topamax [topiramate] Other (See Comments)   Difficulty focusing and speaking         Medication List     TAKE these medications    ibuprofen 800 MG tablet Commonly known as: ADVIL Take 1 tablet (800 mg total) by mouth 3 (three) times daily.   oxyCODONE 5 MG immediate release tablet Commonly known as: Oxy IR/ROXICODONE Take 1-2 tablets (5-10 mg total) by mouth every 4 (four) hours as needed for moderate pain.   polyethylene glycol 17 g packet Commonly known as: MIRALAX / GLYCOLAX Take 17 g by mouth daily as needed for mild constipation.   simethicone 80 MG chewable tablet Commonly known as: MYLICON Chew 1 tablet (80 mg total) by mouth 4 (four) times daily as needed for flatulence.   valACYclovir 500 MG tablet Commonly known as: VALTREX Take 500 mg by mouth daily.         Signed: Cristel Rail A Raahil Ong 01/09/2023, 7:57 AM

## 2023-01-09 NOTE — Progress Notes (Signed)
01/09/2023 11:56 AM Left message on pt. Cell phone that she left stuffed animal in her room. Placed item in clear bag, labeled and placed at front desk.  Kesler Wickham, Blanchard Kelch

## 2023-01-10 ENCOUNTER — Encounter (HOSPITAL_BASED_OUTPATIENT_CLINIC_OR_DEPARTMENT_OTHER): Payer: Self-pay | Admitting: Obstetrics and Gynecology

## 2023-01-10 LAB — SURGICAL PATHOLOGY
